# Patient Record
Sex: Female | Born: 1955 | Race: White | Hispanic: No | Marital: Married | Smoking: Never smoker
Health system: Southern US, Community
[De-identification: ages and names within clinical notes are randomized; demographics above are authoritative.]

## PROBLEM LIST (undated history)

## (undated) DIAGNOSIS — U071 COVID-19: Secondary | ICD-10-CM

## (undated) DIAGNOSIS — R200 Anesthesia of skin: Secondary | ICD-10-CM

## (undated) DIAGNOSIS — I209 Angina pectoris, unspecified: Secondary | ICD-10-CM

## (undated) DIAGNOSIS — I2510 Atherosclerotic heart disease of native coronary artery without angina pectoris: Secondary | ICD-10-CM

## (undated) DIAGNOSIS — I700 Atherosclerosis of aorta: Secondary | ICD-10-CM

## (undated) DIAGNOSIS — R0602 Shortness of breath: Secondary | ICD-10-CM

## (undated) DIAGNOSIS — M1990 Unspecified osteoarthritis, unspecified site: Secondary | ICD-10-CM

## (undated) DIAGNOSIS — M79671 Pain in right foot: Secondary | ICD-10-CM

## (undated) DIAGNOSIS — C187 Malignant neoplasm of sigmoid colon: Secondary | ICD-10-CM

## (undated) DIAGNOSIS — C189 Malignant neoplasm of colon, unspecified: Secondary | ICD-10-CM

## (undated) DIAGNOSIS — R002 Palpitations: Secondary | ICD-10-CM

## (undated) DIAGNOSIS — K219 Gastro-esophageal reflux disease without esophagitis: Secondary | ICD-10-CM

## (undated) HISTORY — DX: Atherosclerotic heart disease of native coronary artery without angina pectoris: I25.10

## (undated) HISTORY — DX: Malignant neoplasm of sigmoid colon: C18.7

## (undated) HISTORY — PX: COLON SURGERY: SHX602

## (undated) HISTORY — PX: BOWEL RESECTION: SHX1257

## (undated) HISTORY — DX: Malignant neoplasm of colon, unspecified: C18.9

## (undated) HISTORY — DX: Shortness of breath: R06.02

## (undated) HISTORY — DX: Angina pectoris, unspecified: I20.9

## (undated) HISTORY — DX: Anesthesia of skin: R20.0

## (undated) HISTORY — DX: Unspecified osteoarthritis, unspecified site: M19.90

## (undated) HISTORY — DX: Pain in right foot: M79.671

## (undated) HISTORY — PX: INCISION AND DRAINAGE ABSCESS: SHX5864

## (undated) HISTORY — DX: Atherosclerosis of aorta: I70.0

## (undated) HISTORY — DX: Palpitations: R00.2

---

## 1978-09-19 HISTORY — PX: ECTOPIC PREGNANCY SURGERY: SHX613

## 1999-06-14 ENCOUNTER — Other Ambulatory Visit: Admission: RE | Admit: 1999-06-14 | Discharge: 1999-06-14 | Payer: Self-pay | Admitting: Obstetrics & Gynecology

## 2003-04-07 ENCOUNTER — Ambulatory Visit (HOSPITAL_COMMUNITY): Admission: RE | Admit: 2003-04-07 | Discharge: 2003-04-07 | Payer: Self-pay | Admitting: Family Medicine

## 2004-03-09 ENCOUNTER — Ambulatory Visit (HOSPITAL_COMMUNITY): Admission: RE | Admit: 2004-03-09 | Discharge: 2004-03-09 | Payer: Self-pay | Admitting: *Deleted

## 2004-05-17 ENCOUNTER — Inpatient Hospital Stay (HOSPITAL_BASED_OUTPATIENT_CLINIC_OR_DEPARTMENT_OTHER): Admission: RE | Admit: 2004-05-17 | Discharge: 2004-05-17 | Payer: Self-pay | Admitting: Cardiology

## 2004-07-16 IMAGING — CR DG CHEST 2V
2 series · 2 of 2 positions shown · non-contrast
Comparison: none

CLINICAL DATA: Chest pain.  
 CHEST (TWO VIEWS)
 Two view chest with no priors.  
 Heart and vascularity normal.  Minimal scarring in the region of the lingula.  No active disease.  Osseous structures intact. 
 IMPRESSION
 No active cardiopulmonary disease.

[view not recorded (1 of 2)]
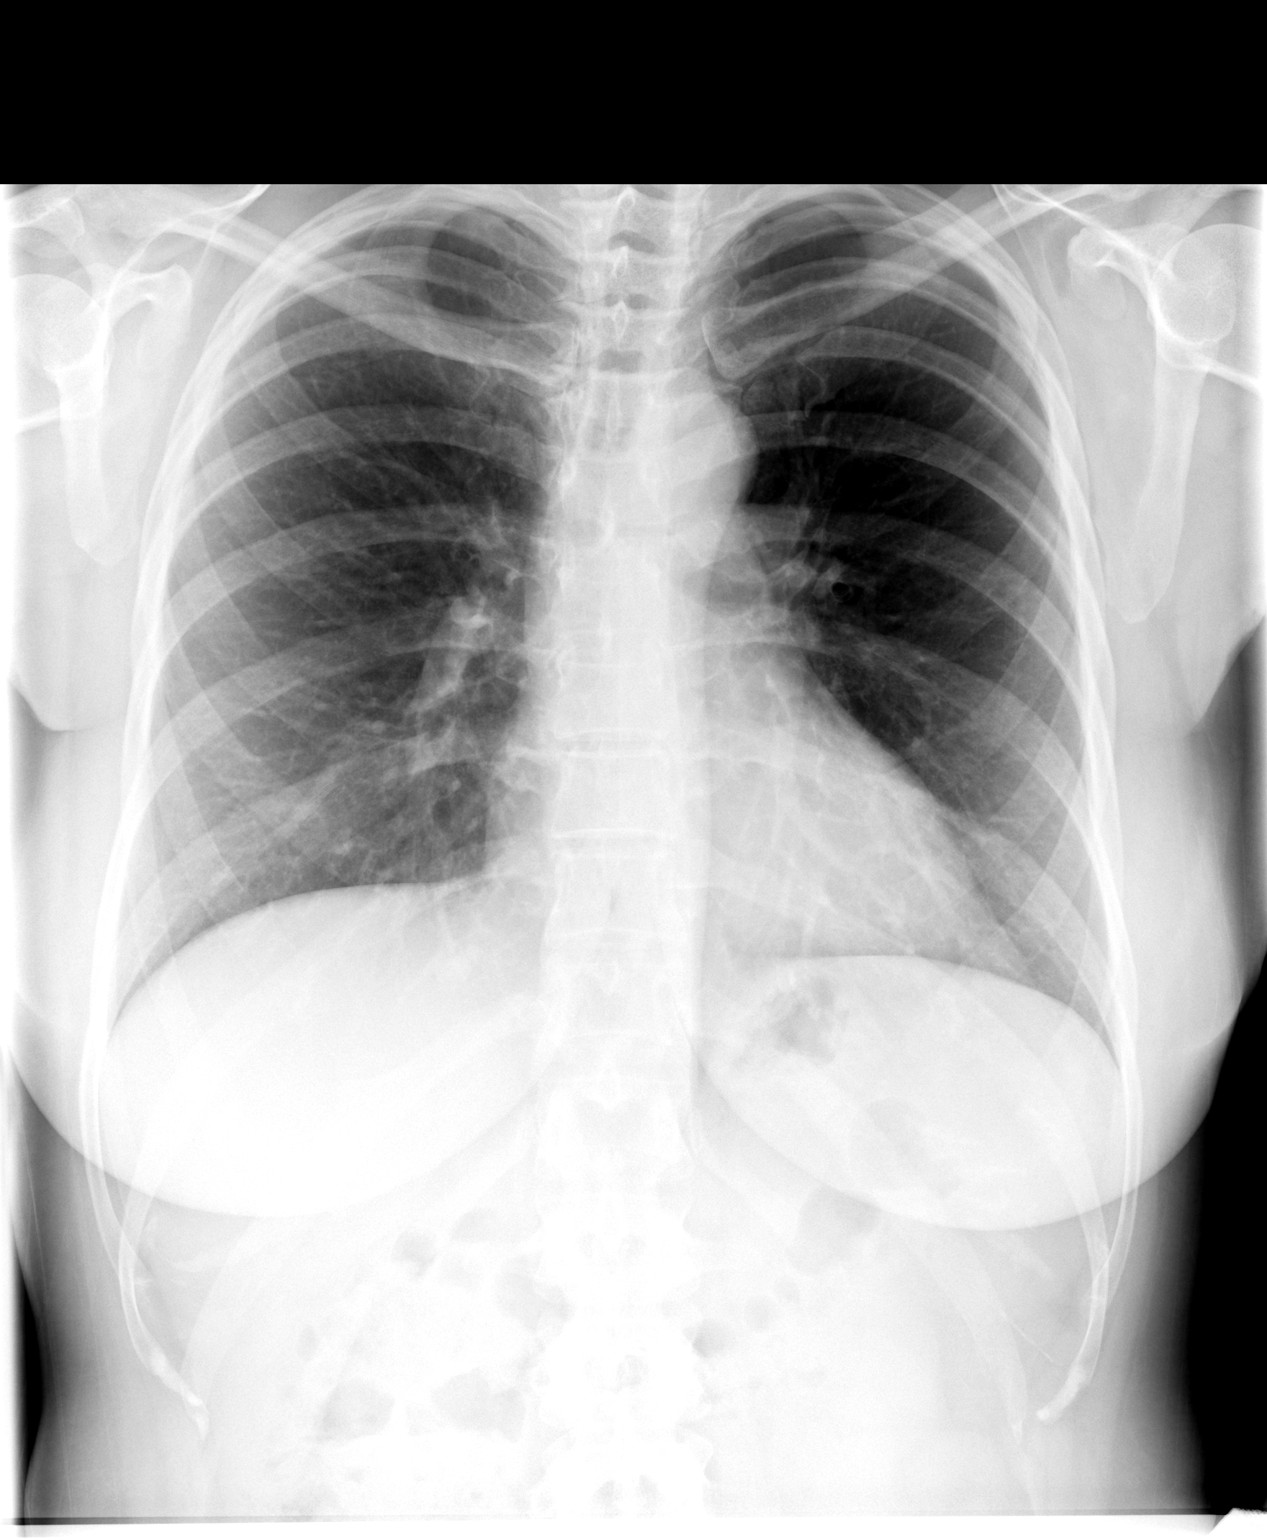

[view not recorded (2 of 2)]
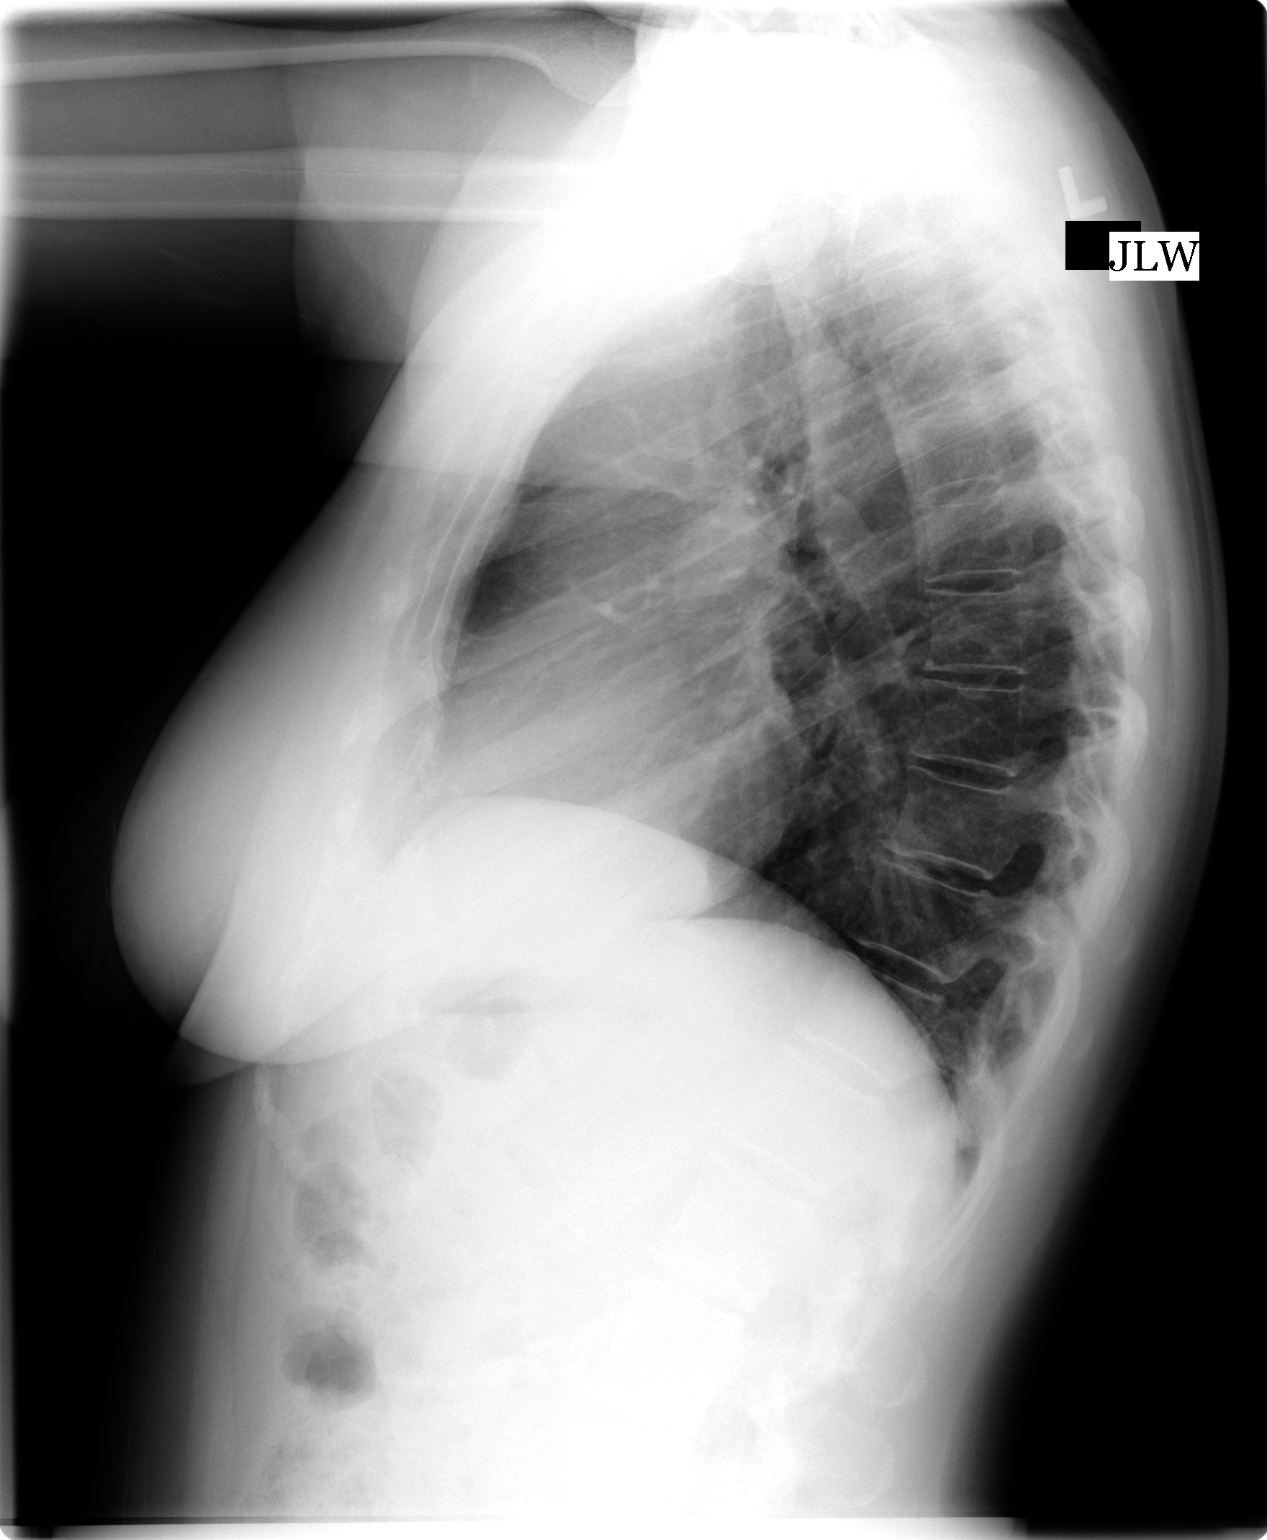

[2 of 2 positions shown; findings below may reference images not displayed]

## 2013-05-06 ENCOUNTER — Other Ambulatory Visit (HOSPITAL_COMMUNITY): Payer: Self-pay | Admitting: *Deleted

## 2013-05-06 NOTE — Telephone Encounter (Signed)
Opened in error on wrong patient 

## 2019-12-04 ENCOUNTER — Telehealth: Payer: Self-pay | Admitting: General Practice

## 2019-12-04 NOTE — Telephone Encounter (Signed)
I am not accepting new patients at this time.

## 2019-12-04 NOTE — Telephone Encounter (Signed)
Pt's husband is a patient of Dr.John and was wanting to know if there was any way that you could be her PCP. Is requesting a female.

## 2019-12-05 NOTE — Telephone Encounter (Signed)
Sorry not accepting new patients at this time.

## 2019-12-30 NOTE — Telephone Encounter (Signed)
Called patient and told her what offices were taking on new patients

## 2021-01-20 DIAGNOSIS — J209 Acute bronchitis, unspecified: Secondary | ICD-10-CM | POA: Diagnosis not present

## 2021-01-20 DIAGNOSIS — J019 Acute sinusitis, unspecified: Secondary | ICD-10-CM | POA: Diagnosis not present

## 2021-01-20 DIAGNOSIS — B349 Viral infection, unspecified: Secondary | ICD-10-CM | POA: Diagnosis not present

## 2021-01-20 DIAGNOSIS — U071 COVID-19: Secondary | ICD-10-CM | POA: Diagnosis not present

## 2021-04-27 DIAGNOSIS — R1031 Right lower quadrant pain: Secondary | ICD-10-CM | POA: Diagnosis not present

## 2021-04-27 DIAGNOSIS — Z6823 Body mass index (BMI) 23.0-23.9, adult: Secondary | ICD-10-CM | POA: Diagnosis not present

## 2021-04-28 ENCOUNTER — Other Ambulatory Visit: Payer: Self-pay

## 2021-04-28 ENCOUNTER — Ambulatory Visit (HOSPITAL_COMMUNITY)
Admission: RE | Admit: 2021-04-28 | Discharge: 2021-04-28 | Disposition: A | Discharge status: Home / Self Care | Payer: Medicare HMO | Source: Ambulatory Visit | Attending: Physician Assistant | Admitting: Physician Assistant

## 2021-04-28 ENCOUNTER — Other Ambulatory Visit: Payer: Self-pay | Admitting: Physician Assistant

## 2021-04-28 ENCOUNTER — Other Ambulatory Visit (HOSPITAL_COMMUNITY): Payer: Self-pay | Admitting: Physician Assistant

## 2021-04-28 DIAGNOSIS — R1031 Right lower quadrant pain: Secondary | ICD-10-CM

## 2021-04-28 DIAGNOSIS — K5720 Diverticulitis of large intestine with perforation and abscess without bleeding: Secondary | ICD-10-CM | POA: Diagnosis not present

## 2021-04-28 DIAGNOSIS — R109 Unspecified abdominal pain: Secondary | ICD-10-CM | POA: Diagnosis not present

## 2021-04-28 DIAGNOSIS — K5792 Diverticulitis of intestine, part unspecified, without perforation or abscess without bleeding: Secondary | ICD-10-CM | POA: Diagnosis not present

## 2021-04-28 MED ORDER — IOHEXOL 9 MG/ML PO SOLN
ORAL | Status: AC
  Filled 2021-04-28: qty 1000

## 2021-04-28 MED ORDER — IOHEXOL 350 MG/ML SOLN
100.0000 mL | Freq: Once | INTRAVENOUS | Status: AC | PRN
  Administered 2021-04-28: 80 mL via INTRAVENOUS

## 2021-04-30 ENCOUNTER — Other Ambulatory Visit: Payer: Self-pay

## 2021-04-30 ENCOUNTER — Encounter (HOSPITAL_COMMUNITY): Payer: Self-pay | Admitting: Internal Medicine

## 2021-04-30 ENCOUNTER — Inpatient Hospital Stay (HOSPITAL_COMMUNITY)
Admission: EM | Admit: 2021-04-30 | Discharge: 2021-05-03 | DRG: 392 | Disposition: A | Discharge status: Home / Self Care | Payer: Medicare HMO | Attending: Internal Medicine | Admitting: Internal Medicine

## 2021-04-30 DIAGNOSIS — Z8616 Personal history of COVID-19: Secondary | ICD-10-CM

## 2021-04-30 DIAGNOSIS — Z833 Family history of diabetes mellitus: Secondary | ICD-10-CM

## 2021-04-30 DIAGNOSIS — Z8249 Family history of ischemic heart disease and other diseases of the circulatory system: Secondary | ICD-10-CM

## 2021-04-30 DIAGNOSIS — K5720 Diverticulitis of large intestine with perforation and abscess without bleeding: Secondary | ICD-10-CM | POA: Insufficient documentation

## 2021-04-30 DIAGNOSIS — K219 Gastro-esophageal reflux disease without esophagitis: Secondary | ICD-10-CM | POA: Diagnosis not present

## 2021-04-30 DIAGNOSIS — K5792 Diverticulitis of intestine, part unspecified, without perforation or abscess without bleeding: Secondary | ICD-10-CM | POA: Diagnosis present

## 2021-04-30 HISTORY — DX: Gastro-esophageal reflux disease without esophagitis: K21.9

## 2021-04-30 HISTORY — DX: COVID-19: U07.1

## 2021-04-30 LAB — CBC WITH DIFFERENTIAL/PLATELET
Abs Immature Granulocytes: 0.04 10*3/uL (ref 0.00–0.07)
Basophils Absolute: 0.1 10*3/uL (ref 0.0–0.1)
Basophils Relative: 1 %
Eosinophils Absolute: 0.2 10*3/uL (ref 0.0–0.5)
Eosinophils Relative: 3 %
HCT: 40.1 % (ref 36.0–46.0)
Hemoglobin: 12.3 g/dL (ref 12.0–15.0)
Immature Granulocytes: 1 %
Lymphocytes Relative: 21 %
Lymphs Abs: 1.8 10*3/uL (ref 0.7–4.0)
MCH: 26.6 pg (ref 26.0–34.0)
MCHC: 30.7 g/dL (ref 30.0–36.0)
MCV: 86.8 fL (ref 80.0–100.0)
Monocytes Absolute: 0.6 10*3/uL (ref 0.1–1.0)
Monocytes Relative: 7 %
Neutro Abs: 6 10*3/uL (ref 1.7–7.7)
Neutrophils Relative %: 67 %
Platelets: 427 10*3/uL — ABNORMAL HIGH (ref 150–400)
RBC: 4.62 MIL/uL (ref 3.87–5.11)
RDW: 12.3 % (ref 11.5–15.5)
WBC: 8.8 10*3/uL (ref 4.0–10.5)
nRBC: 0 % (ref 0.0–0.2)

## 2021-04-30 LAB — COMPREHENSIVE METABOLIC PANEL
ALT: 9 U/L (ref 0–44)
AST: 14 U/L — ABNORMAL LOW (ref 15–41)
Albumin: 3.3 g/dL — ABNORMAL LOW (ref 3.5–5.0)
Alkaline Phosphatase: 42 U/L (ref 38–126)
Anion gap: 9 (ref 5–15)
BUN: 9 mg/dL (ref 8–23)
CO2: 25 mmol/L (ref 22–32)
Calcium: 9.1 mg/dL (ref 8.9–10.3)
Chloride: 106 mmol/L (ref 98–111)
Creatinine, Ser: 0.64 mg/dL (ref 0.44–1.00)
GFR, Estimated: >60 mL/min (ref >60)
Glucose, Bld: 93 mg/dL (ref 70–99)
Potassium: 3.7 mmol/L (ref 3.5–5.1)
Sodium: 140 mmol/L (ref 135–145)
Total Bilirubin: 0.4 mg/dL (ref 0.3–1.2)
Total Protein: 6.5 g/dL (ref 6.5–8.1)

## 2021-04-30 LAB — LIPASE, BLOOD: Lipase: 28 U/L (ref 11–51)

## 2021-04-30 LAB — URINALYSIS, ROUTINE W REFLEX MICROSCOPIC
Bilirubin Urine: NEGATIVE
Glucose, UA: NEGATIVE mg/dL
Hgb urine dipstick: NEGATIVE
Ketones, ur: NEGATIVE mg/dL
Leukocytes,Ua: NEGATIVE
Nitrite: NEGATIVE
Protein, ur: NEGATIVE mg/dL
Specific Gravity, Urine: 1.005 (ref 1.005–1.030)
pH: 7 (ref 5.0–8.0)

## 2021-04-30 LAB — RESP PANEL BY RT-PCR (FLU A&B, COVID) ARPGX2
Influenza A by PCR: NEGATIVE
Influenza B by PCR: NEGATIVE
SARS Coronavirus 2 by RT PCR: NEGATIVE

## 2021-04-30 MED ORDER — ENOXAPARIN SODIUM 40 MG/0.4ML IJ SOSY
40.0000 mg | PREFILLED_SYRINGE | INTRAMUSCULAR | Status: DC
  Administered 2021-05-01 – 2021-05-02 (×3): 40 mg via SUBCUTANEOUS
  Filled 2021-04-30 (×4): qty 0.4

## 2021-04-30 MED ORDER — LACTATED RINGERS IV SOLN: INTRAVENOUS | Status: AC

## 2021-04-30 MED ORDER — PANTOPRAZOLE SODIUM 40 MG PO TBEC
40.0000 mg | DELAYED_RELEASE_TABLET | Freq: Every day | ORAL | Status: DC
  Administered 2021-05-01 – 2021-05-03 (×3): 40 mg via ORAL
  Filled 2021-04-30 (×3): qty 1

## 2021-04-30 MED ORDER — SODIUM CHLORIDE 0.9% FLUSH
3.0000 mL | Freq: Two times a day (BID) | INTRAVENOUS | Status: DC
  Administered 2021-05-01 – 2021-05-03 (×5): 3 mL via INTRAVENOUS

## 2021-04-30 MED ORDER — ACETAMINOPHEN 325 MG PO TABS: 650.0000 mg | ORAL_TABLET | Freq: Four times a day (QID) | ORAL | Status: DC | PRN

## 2021-04-30 MED ORDER — ACETAMINOPHEN 650 MG RE SUPP: 650.0000 mg | Freq: Four times a day (QID) | RECTAL | Status: DC | PRN

## 2021-04-30 MED ORDER — LACTATED RINGERS IV BOLUS
1000.0000 mL | Freq: Once | INTRAVENOUS | Status: AC
  Administered 2021-04-30: 1000 mL via INTRAVENOUS

## 2021-04-30 MED ORDER — SODIUM CHLORIDE 0.9 % IV SOLN
2.0000 g | Freq: Once | INTRAVENOUS | Status: AC
  Administered 2021-04-30: 2 g via INTRAVENOUS
  Filled 2021-04-30: qty 20

## 2021-04-30 MED ORDER — HYDROCODONE-ACETAMINOPHEN 5-325 MG PO TABS: 1.0000 | ORAL_TABLET | Freq: Four times a day (QID) | ORAL | Status: DC | PRN

## 2021-04-30 MED ORDER — SODIUM CHLORIDE 0.9 % IV SOLN
2.0000 g | INTRAVENOUS | Status: DC
  Administered 2021-05-01 – 2021-05-02 (×2): 2 g via INTRAVENOUS
  Filled 2021-04-30 (×3): qty 20

## 2021-04-30 MED ORDER — METRONIDAZOLE 500 MG/100ML IV SOLN
500.0000 mg | Freq: Two times a day (BID) | INTRAVENOUS | Status: DC
  Administered 2021-05-01 – 2021-05-03 (×5): 500 mg via INTRAVENOUS
  Filled 2021-04-30 (×5): qty 100

## 2021-04-30 MED ORDER — METRONIDAZOLE 500 MG/100ML IV SOLN
500.0000 mg | Freq: Once | INTRAVENOUS | Status: AC
  Administered 2021-04-30: 500 mg via INTRAVENOUS
  Filled 2021-04-30: qty 100

## 2021-04-30 NOTE — ED Notes (Signed)
Denies pain or nausea

## 2021-04-30 NOTE — ED Triage Notes (Signed)
Pt here  from home with c/o abd pain , had a scan that showed diverticulitis , started on antibiotics , day 3

## 2021-04-30 NOTE — ED Notes (Signed)
EDP at [facility]. Pt alert, NAD, calm, interactive, resps e/u, speaking in clear complete sentences. Triage order results reviewed.

## 2021-04-30 NOTE — ED Notes (Addendum)
Reports sx onset 3 weeks ago, has been taking ABT orally at home for last 3d. Describes lower abd pressure, some back discomfort, also NV, no diarrhea. No vomiting in last 24 hours. Recent CT shows diverticulitis. Mentions some occ blood, but also has hemorrhoids also. Denies pain nausea or other sx at this time.

## 2021-04-30 NOTE — ED Provider Notes (Signed)
[facility] EMERGENCY DEPARTMENT Provider Note   CSN: [id number] Arrival date & time: 04/30/21  1445     History No chief complaint on file.   Vanessa Mcclure is a 65 y.o. female.  Patient is a 65 year old female with a history of GERD but otherwise is healthy presenting today with 3 weeks of persistent abdominal pain, frequent loose stools and intermittent fevers.  She has had no nausea or vomiting.  She saw her doctor on Tuesday and was sent for a CAT scan on Wednesday.  The CAT scan on Wednesday reported that she had diverticulitis and she started Augmentin.  She has had 4 doses of the Augmentin but continues to have slightly worsening abdominal pain and ongoing stools up to 5 or 6/day without blood.  She has been eating very little.  Her doctor called her today and because of the CAT scan results told her she needed to go to the emergency room.  The pain is crampy and comes in waves.  There is always a baseline pain but it is worse at times.  Does not radiate.  She denies any urinary symptoms.  No cough congestion or shortness of breath.  No similar symptoms in the past.  She has never had a colonoscopy.  The history is provided by the patient and medical records.      No past medical history on file.  There are no problems to display for this patient.      OB History   No obstetric history on file.     No family history on file.     Home Medications Prior to Admission medications   Not on File    Allergies    Patient has no allergy information on record.  Review of Systems   Review of Systems  All other systems reviewed and are negative.  Physical Exam Updated Vital Signs BP 118/72 (BP Location: Right Arm)   Pulse 78   Temp 97.9 F (36.6 C) (Oral)   Resp 16   SpO2 97%   Physical Exam Vitals and nursing note reviewed.  Constitutional:      General: She is not in acute distress.    Appearance: Normal appearance. She is well-developed.   HENT:     Head: Normocephalic and atraumatic.  Eyes:     Pupils: Pupils are equal, round, and reactive to light.  Cardiovascular:     Rate and Rhythm: Normal rate and regular rhythm.     Heart sounds: Normal heart sounds. No murmur heard.   No friction rub.  Pulmonary:     Effort: Pulmonary effort is normal.     Breath sounds: Normal breath sounds. No wheezing or rales.  Abdominal:     General: Bowel sounds are normal. There is no distension.     Palpations: Abdomen is soft.     Tenderness: There is abdominal tenderness in the right lower quadrant, suprapubic area and left lower quadrant. There is no right CVA tenderness, left CVA tenderness, guarding or rebound.  Musculoskeletal:        General: No tenderness. Normal range of motion.     Comments: No edema  Skin:    General: Skin is warm and dry.     Findings: No rash.  Neurological:     Mental Status: She is alert and oriented to person, place, and time. Mental status is at baseline.     Cranial Nerves: No cranial nerve deficit.  Psychiatric:  Mood and Affect: Mood normal.        Behavior: Behavior normal.    ED Results / Procedures / Treatments   Labs (all labs ordered are listed, but only abnormal results are displayed) Labs Reviewed  CBC WITH DIFFERENTIAL/PLATELET - Abnormal; Notable for the following components:      Result Value   Platelets 427 (*)    All other components within normal limits  COMPREHENSIVE METABOLIC PANEL - Abnormal; Notable for the following components:   Albumin 3.3 (*)    AST 14 (*)    All other components within normal limits  URINALYSIS, ROUTINE W REFLEX MICROSCOPIC - Abnormal; Notable for the following components:   Color, Urine COLORLESS (*)    All other components within normal limits  RESP PANEL BY RT-PCR (FLU A&B, COVID) ARPGX2  LIPASE, BLOOD    EKG None  Radiology No results found.  Procedures Procedures   Medications Ordered in ED Medications  cefTRIAXone  (ROCEPHIN) 2 g in sodium chloride 0.9 % 100 mL IVPB (has no administration in time range)    And  metroNIDAZOLE (FLAGYL) IVPB 500 mg (has no administration in time range)  lactated ringers bolus 1,000 mL (has no administration in time range)    ED Course  I have reviewed the triage vital signs and the nursing notes.  Pertinent labs & imaging results that were available during my care of the patient were reviewed by me and considered in my medical decision making (see chart for details).    MDM Rules/Calculators/A&P                           Patient presenting today with persistent abdominal pain.  She had a CT done on Wednesday 2 days prior to arrival that showed acute diverticulitis with developing abscess and minimal amount of air.  She had taken 4 doses of Augmentin but her doctor called her today and told her she needed to go to the emergency room.  She is well-appearing at baseline with normal vital signs.  Labs are reassuring.  However her pain is no better so suspect with only 4 doses of antibiotics her scan will not look much different.  Will admit for IV antibiotics.  MDM   Amount and/or Complexity of Data Reviewed Clinical lab tests: ordered and reviewed Tests in the radiology section of CPT: reviewed Independent visualization of images, tracings, or specimens: yes  Patient Progress Patient progress: stable   Final Clinical Impression(s) / ED Diagnoses Final diagnoses:  Diverticulitis of large intestine with abscess without bleeding    Rx / DC Orders ED Discharge Orders     None        Blanchie Dessert, MD 04/30/21 [phone]

## 2021-04-30 NOTE — ED Provider Notes (Signed)
Emergency Medicine Provider Triage Evaluation Note  Vanessa Mcclure , a 65 y.o. female  was evaluated in triage.  Pt complains of abd pain. Had ct which showed diverticulitis with abscess  Review of Systems  Positive: Abd pain Negative: nv  Physical Exam  There were no vitals taken for this visit. Gen:   Awake, no distress   Resp:  Normal effort  MSK:   Moves extremities without difficulty  Other:  Llq abd ttp  Medical Decision Making  Medically screening exam initiated at 3:16 PM.  Appropriate orders placed.  Vanessa Mcclure was informed that the remainder of the evaluation will be completed by another provider, this initial triage assessment does not replace that evaluation, and the importance of remaining in the ED until their evaluation is complete.     Rodney Booze, PA-C 04/30/21 1517    Pattricia Boss, MD 05/04/21 [phone]

## 2021-04-30 NOTE — H&P (Signed)
History and Physical   Vanessa Mcclure [id number] DOB: 1956/02/26 DOA: 04/30/2021  PCP: Rosalee Kaufman, PA-C   Patient coming from: Home  Chief Complaint: Abdominal pain, diverticulitis  HPI: Vanessa Mcclure is a 65 y.o. female with medical history significant of GERD, prior COVID-19 infection in May who presents with ongoing abdominal pain shown to be diverticulitis outpatient. Patient has had about 3 weeks of lower abdominal pain with associated diarrhea and intermittent fever.  She saw her outpatient doctor on Tuesday and was sent for CT on Wednesday found to have diverticulitis and was started on Augmentin.  Her symptoms were persistent and had worsened despite the Augmentin which she was received 4 doses and her doctor called her to discuss the CT results in detail which showed acute diverticulitis of the sigmoid colon and early developing 2 cm abscess and recommended to proceed to the ED for IV antibiotics and further treatment. Patient denies chills, chest pain, shortness of breath, constipation, nausea, vomiting.   ED Course: Vital signs in the ED are stable.  Lab work-up showed CMP with albumin 3.3.  CBC showed only platelets of 427.  Lipase normal.  Respiratory panel flu COVID pending.  Urinalysis normal.  No repeat imaging performed CT results done outpatient showed acute diverticulitis of the sigmoid colon with early developing 2 cm abscess and hiatal hernia with a simple ovarian cyst also noted.  Patient started on ceftriaxone and Flagyl in the ED.  Also received 1 L of fluids.  Review of Systems: As per HPI otherwise all other systems reviewed and are negative.  Past Medical History:  Diagnosis Date   COVID-19    GERD (gastroesophageal reflux disease)     Past Surgical History:  Procedure Laterality Date   Draper    Social History  reports that she has never smoked. She has never used smokeless tobacco. She reports that she does not  drink alcohol and does not use drugs.  Not on File  Family History  Problem Relation Age of Onset   Diabetes Mother    Heart disease Mother    Cancer Father    Heart disease Father    Cancer Brother   Reviewed on admission  Prior to Admission medications   Not on File  Omeprazole Magnesium Biotin  Physical Exam: Vitals:   04/30/21 1615  BP: 118/72  Pulse: 78  Resp: 16  Temp: 97.9 F (36.6 C)  TempSrc: Oral  SpO2: 97%   Physical Exam Constitutional:      General: She is not in acute distress.    Appearance: Normal appearance.  HENT:     Head: Normocephalic and atraumatic.     Mouth/Throat:     Mouth: Mucous membranes are moist.     Pharynx: Oropharynx is clear.  Eyes:     Extraocular Movements: Extraocular movements intact.     Pupils: Pupils are equal, round, and reactive to light.  Cardiovascular:     Rate and Rhythm: Normal rate and regular rhythm.     Pulses: Normal pulses.     Heart sounds: Normal heart sounds.  Pulmonary:     Effort: Pulmonary effort is normal. No respiratory distress.     Breath sounds: Normal breath sounds.  Abdominal:     General: Bowel sounds are normal. There is no distension.     Palpations: Abdomen is soft.     Tenderness: There is abdominal tenderness.  Musculoskeletal:        General:  No swelling or deformity.  Skin:    General: Skin is warm and dry.  Neurological:     General: No focal deficit present.     Mental Status: Mental status is at baseline.   Labs on Admission: I have personally reviewed following labs and imaging studies  CBC: Recent Labs  Lab 04/30/21 1518  WBC 8.8  NEUTROABS 6.0  HGB 12.3  HCT 40.1  MCV 86.8  PLT 427*    Basic Metabolic Panel: Recent Labs  Lab 04/30/21 1518  NA 140  K 3.7  CL 106  CO2 25  GLUCOSE 93  BUN 9  CREATININE 0.64  CALCIUM 9.1    GFR: CrCl cannot be calculated (Unknown ideal weight.).  Liver Function Tests: Recent Labs  Lab 04/30/21 1518  AST 14*  ALT  9  ALKPHOS 42  BILITOT 0.4  PROT 6.5  ALBUMIN 3.3*    Urine analysis:    Component Value Date/Time   COLORURINE COLORLESS (A) 04/30/2021 1518   APPEARANCEUR CLEAR 04/30/2021 1518   LABSPEC 1.005 04/30/2021 1518   PHURINE 7.0 04/30/2021 1518   [employer] 04/30/2021 1518   HGBUR NEGATIVE 04/30/2021 1518   BILIRUBINUR NEGATIVE 04/30/2021 1518   KETONESUR NEGATIVE 04/30/2021 1518   PROTEINUR NEGATIVE 04/30/2021 1518   NITRITE NEGATIVE 04/30/2021 1518   LEUKOCYTESUR NEGATIVE 04/30/2021 1518    Radiological Exams on Admission: No results found.  EKG: Not performed in ED  Assessment/Plan Principal Problem:   Acute diverticulitis Active Problems:   GERD (gastroesophageal reflux disease)  Acute diverticulitis > 3 weeks of lower abdominal pain.  With diarrhea and intermittent fevers. > CT outpatient showed acute diverticulitis with developing abscess. > Had taken 4 doses of Augmentin but symptoms were persistent/worsening and considering abscess she was sent to the ED for IV antibiotics. > Has remained stable in the ED.  Was started on ceftriaxone and Flagyl. - Continue with ceftriaxone and Flagyl - Full liquid diet - Continue IV fluids - We will provide pain control as needed  GERD - Continue home PPI   DVT prophylaxis: Lovenox  Code Status:   Full Family Communication:  Significant other updated at bedside Disposition Plan:   Patient is from:  Home  Anticipated DC to:  Home  Anticipated DC date:  1 to 3 days  Anticipated DC barriers: None  Consults called:  None Admission status:  Observation, MedSurg  Severity of Illness: The appropriate patient status for this patient is OBSERVATION. Observation status is judged to be reasonable and necessary in order to provide the required intensity of service to ensure the patient's safety. The patient's presenting symptoms, physical exam findings, and initial radiographic and laboratory data in the context of their  medical condition is felt to place them at decreased risk for further clinical deterioration. Furthermore, it is anticipated that the patient will be medically stable for discharge from the hospital within 2 midnights of admission. The following factors support the patient status of observation.   " The patient's presenting symptoms include abdominal pain, fever, diarrhea diverticulitis on outpatient CT scan. " The physical exam findings include abdominal pain. " The initial radiographic and laboratory data are ab work-up showed CMP with albumin 3.3.  CBC showed only platelets of 427.  Lipase normal.  Respiratory panel flu COVID pending.  Urinalysis normal.  No repeat imaging performed CT results done outpatient showed acute diverticulitis of the sigmoid colon with early developing 2 cm abscess and hiatal hernia with a simple ovarian cyst also  noted.   Marcelyn Bruins MD [facility]  How to contact the [facility] Attending or Consulting provider [place] or covering provider during after hours [place], for this patient?   Check the care team in [facility] and look for a) attending/consulting [facility] provider listed and b) the [facility] team listed Log into [email or web address] and use [place]'s universal password to access. If you do not have the password, please contact the hospital operator. Locate the [facility] provider you are looking for under [facility] and page to a number that you can be directly reached. If you still have difficulty reaching the provider, please page the [facility] (Director on Call) for the Hospitalists listed on amion for assistance.  04/30/2021, 7:30 PM

## 2021-05-01 DIAGNOSIS — K5792 Diverticulitis of intestine, part unspecified, without perforation or abscess without bleeding: Secondary | ICD-10-CM

## 2021-05-01 LAB — COMPREHENSIVE METABOLIC PANEL
ALT: 9 U/L (ref 0–44)
AST: 13 U/L — ABNORMAL LOW (ref 15–41)
Albumin: 2.8 g/dL — ABNORMAL LOW (ref 3.5–5.0)
Alkaline Phosphatase: 38 U/L (ref 38–126)
Anion gap: 7 (ref 5–15)
BUN: 6 mg/dL — ABNORMAL LOW (ref 8–23)
CO2: 29 mmol/L (ref 22–32)
Calcium: 8.9 mg/dL (ref 8.9–10.3)
Chloride: 106 mmol/L (ref 98–111)
Creatinine, Ser: 0.64 mg/dL (ref 0.44–1.00)
GFR, Estimated: >60 mL/min (ref >60)
Glucose, Bld: 92 mg/dL (ref 70–99)
Potassium: 4 mmol/L (ref 3.5–5.1)
Sodium: 142 mmol/L (ref 135–145)
Total Bilirubin: 0.1 mg/dL — ABNORMAL LOW (ref 0.3–1.2)
Total Protein: 5.6 g/dL — ABNORMAL LOW (ref 6.5–8.1)

## 2021-05-01 LAB — HIV ANTIBODY (ROUTINE TESTING W REFLEX): HIV Screen 4th Generation wRfx: NONREACTIVE

## 2021-05-01 LAB — CBC
HCT: 35.2 % — ABNORMAL LOW (ref 36.0–46.0)
Hemoglobin: 11.2 g/dL — ABNORMAL LOW (ref 12.0–15.0)
MCH: 27.4 pg (ref 26.0–34.0)
MCHC: 31.8 g/dL (ref 30.0–36.0)
MCV: 86.1 fL (ref 80.0–100.0)
Platelets: 382 10*3/uL (ref 150–400)
RBC: 4.09 MIL/uL (ref 3.87–5.11)
RDW: 12.3 % (ref 11.5–15.5)
WBC: 6.2 10*3/uL (ref 4.0–10.5)
nRBC: 0 % (ref 0.0–0.2)

## 2021-05-01 NOTE — Progress Notes (Signed)
PROGRESS NOTE    Vanessa Mcclure  [id number] DOB: 1955-11-30 DOA: 04/30/2021 PCP: Rosalee Kaufman, PA-C   Brief Narrative:  Vanessa Mcclure is a 65 y.o. female with medical history significant of GERD, prior COVID-19 infection in May who presents with ongoing abdominal pain shown to be diverticulitis outpatient.  After 3 weeks of worsening abdominal pain outpatient CT shows diverticulitis started on Augmentin with worsening symptoms, repeat CT here shows again diverticulitis with early developing 2 cm abscess, admitted to the hospital for IV antibiotics given failure of outpatient therapy and worsening infection.  Assessment & Plan:   Principal Problem:   Acute diverticulitis Active Problems:   GERD (gastroesophageal reflux disease)  Acute intractable abdominal pain secondary to acute diverticulitis, failure of outpatient therapy, POA -Patient does not meet sepsis criteria despite her profound infection with failure of outpatient antibiotics with multiple doses of Augmentin -Continue ceftriaxone and Flagyl, follow clinically -Cultures pending to ensure no disseminated infection -Full liquid diet, will advance slowly as tolerated -IV fluids in the interim until p.o. intake is appropriate   GERD - Continue home PPI     DVT prophylaxis:      Lovenox  Code Status:              Full Family Communication: None available, patient to update  Status is: Inpatient  Dispo: The patient is from: Home              Anticipated d/c is to: Home              Anticipated d/c date is: 24 to 48 hours              Patient currently not medically stable for discharge  Consultants:  None  Procedures:  None  Antimicrobials:  Ceftriaxone Flagyl  Subjective: No acute issues or events overnight, abdominal pain improving, denies nausea vomiting headache fever chills chest pain diarrhea or constipation.  Objective: Vitals:   04/30/21 1615 04/30/21 2319 05/01/21 0536  BP: 118/72  (!) 144/76 117/62  Pulse: 78 62 60  Resp: '16 18 18  '$ Temp: 97.9 F (36.6 C) 98.5 F (36.9 C) 98.4 F (36.9 C)  TempSrc: Oral Oral Oral  SpO2: 97% 98% 97%  Weight:   63.4 kg    Intake/Output Summary (Last 24 hours) at 05/01/2021 0800 Last data filed at 05/01/2021 0602 Gross per 24 hour  Intake 1955 ml  Output --  Net 1955 ml   Filed Weights   05/01/21 0536  Weight: 63.4 kg    Examination:  General:  Pleasantly resting in bed, No acute distress. HEENT:  Normocephalic atraumatic.  Sclerae nonicteric, noninjected.  Extraocular movements intact bilaterally. Neck:  Without mass or deformity.  Trachea is midline. Lungs:  Clear to auscultate bilaterally without rhonchi, wheeze, or rales. Heart:  Regular rate and rhythm.  Without murmurs, rubs, or gallops. Abdomen:  Soft, diffusely tender without rebound or guarding, no PMI40 Extremities: Without cyanosis, clubbing, edema, or obvious deformity. Vascular:  Dorsalis pedis and posterior tibial pulses palpable bilaterally. Skin:  Warm and dry, no erythema, no ulcerations.   Data Reviewed: I have personally reviewed following labs and imaging studies  CBC: Recent Labs  Lab 04/30/21 1518 05/01/21 0449  WBC 8.8 6.2  NEUTROABS 6.0  --   HGB 12.3 11.2*  HCT 40.1 35.2*  MCV 86.8 86.1  PLT 427* [id number]   Basic Metabolic Panel: Recent Labs  Lab 04/30/21 1518 05/01/21 0449  NA 140 142  K  3.7 4.0  CL 106 106  CO2 25 29  GLUCOSE 93 92  BUN 9 6*  CREATININE 0.64 0.64  CALCIUM 9.1 8.9   GFR: CrCl cannot be calculated (Unknown ideal weight.). Liver Function Tests: Recent Labs  Lab 04/30/21 1518 05/01/21 0449  AST 14* 13*  ALT 9 9  ALKPHOS 42 38  BILITOT 0.4 0.1*  PROT 6.5 5.6*  ALBUMIN 3.3* 2.8*   Recent Labs  Lab 04/30/21 1518  LIPASE 28   No results for input(s): AMMONIA in the last 168 hours. Coagulation Profile: No results for input(s): INR, PROTIME in the last 168 hours. Cardiac Enzymes: No results for  input(s): CKTOTAL, CKMB, CKMBINDEX, TROPONINI in the last 168 hours. BNP (last 3 results) No results for input(s): PROBNP in the last 8760 hours. HbA1C: No results for input(s): HGBA1C in the last 72 hours. CBG: No results for input(s): GLUCAP in the last 168 hours. Lipid Profile: No results for input(s): CHOL, HDL, LDLCALC, TRIG, CHOLHDL, LDLDIRECT in the last 72 hours. Thyroid Function Tests: No results for input(s): TSH, T4TOTAL, FREET4, T3FREE, THYROIDAB in the last 72 hours. Anemia Panel: No results for input(s): VITAMINB12, FOLATE, FERRITIN, TIBC, IRON, RETICCTPCT in the last 72 hours. Sepsis Labs: No results for input(s): PROCALCITON, LATICACIDVEN in the last 168 hours.  Recent Results (from the past 240 hour(s))  Resp Panel by RT-PCR (Flu A&B, Covid) Nasopharyngeal Swab     Status: None   Collection Time: 04/30/21  7:00 PM   Specimen: Nasopharyngeal Swab; Nasopharyngeal(NP) swabs in vial transport medium  Result Value Ref Range Status   SARS Coronavirus 2 by RT PCR NEGATIVE NEGATIVE Final    Comment: (NOTE) SARS-CoV-2 target nucleic acids are NOT DETECTED.  The SARS-CoV-2 RNA is generally detectable in upper respiratory specimens during the acute phase of infection. The lowest concentration of SARS-CoV-2 viral copies this assay can detect is 138 copies/mL. A negative result does not preclude SARS-Cov-2 infection and should not be used as the sole basis for treatment or other patient management decisions. A negative result may occur with  improper specimen collection/handling, submission of specimen other than nasopharyngeal swab, presence of viral mutation(s) within the areas targeted by this assay, and inadequate number of viral copies(<138 copies/mL). A negative result must be combined with clinical observations, patient history, and epidemiological information. The expected result is Negative.  Fact Sheet for Patients:   EntrepreneurPulse.com.au  Fact Sheet for Healthcare Providers:  IncredibleEmployment.be  This test is no t yet approved or cleared by the [place] FDA and  has been authorized for detection and/or diagnosis of SARS-CoV-2 by FDA under an Emergency Use Authorization (EUA). This EUA will remain  in effect (meaning this test can be used) for the duration of the COVID-19 declaration under Section 564(b)(1) of the Act, 21 U.S.C.section 360bbb-3(b)(1), unless the authorization is terminated  or revoked sooner.       Influenza A by PCR NEGATIVE NEGATIVE Final   Influenza B by PCR NEGATIVE NEGATIVE Final    Comment: (NOTE) The Xpert Xpress SARS-CoV-2/FLU/RSV plus assay is intended as an aid in the diagnosis of influenza from Nasopharyngeal swab specimens and should not be used as a sole basis for treatment. Nasal washings and aspirates are unacceptable for Xpert Xpress SARS-CoV-2/FLU/RSV testing.  Fact Sheet for Patients: EntrepreneurPulse.com.au  Fact Sheet for Healthcare Providers: IncredibleEmployment.be  This test is not yet approved or cleared by the [place] FDA and has been authorized for detection and/or diagnosis of SARS-CoV-2 by FDA  under an Emergency Use Authorization (EUA). This EUA will remain in effect (meaning this test can be used) for the duration of the COVID-19 declaration under Section 564(b)(1) of the Act, 21 U.S.C. section 360bbb-3(b)(1), unless the authorization is terminated or revoked.  Performed at [facility], [place] [address] [place], [place] [address]     Radiology Studies: No results found.  Scheduled Meds:  enoxaparin (LOVENOX) injection  40 mg Subcutaneous Q24H   pantoprazole  40 mg Oral Daily   sodium chloride flush  3 mL Intravenous Q12H   Continuous Infusions:  cefTRIAXone (ROCEPHIN)  IV     metronidazole 500 mg (05/01/21 0449)    LOS: 1 day   Time  spent: 40 min  Little Ishikawa, DO [facility]  If 7PM-7AM, please contact night-coverage [email or web address]  05/01/2021, 8:00 AM

## 2021-05-02 LAB — COMPREHENSIVE METABOLIC PANEL
ALT: 9 U/L (ref 0–44)
AST: 12 U/L — ABNORMAL LOW (ref 15–41)
Albumin: 3 g/dL — ABNORMAL LOW (ref 3.5–5.0)
Alkaline Phosphatase: 37 U/L — ABNORMAL LOW (ref 38–126)
Anion gap: 7 (ref 5–15)
BUN: <5 mg/dL — ABNORMAL LOW (ref 8–23)
CO2: 28 mmol/L (ref 22–32)
Calcium: 8.8 mg/dL — ABNORMAL LOW (ref 8.9–10.3)
Chloride: 107 mmol/L (ref 98–111)
Creatinine, Ser: 0.7 mg/dL (ref 0.44–1.00)
GFR, Estimated: >60 mL/min (ref >60)
Glucose, Bld: 94 mg/dL (ref 70–99)
Potassium: 3.8 mmol/L (ref 3.5–5.1)
Sodium: 142 mmol/L (ref 135–145)
Total Bilirubin: 0.2 mg/dL — ABNORMAL LOW (ref 0.3–1.2)
Total Protein: 6 g/dL — ABNORMAL LOW (ref 6.5–8.1)

## 2021-05-02 LAB — CBC
HCT: 36.9 % (ref 36.0–46.0)
Hemoglobin: 11.8 g/dL — ABNORMAL LOW (ref 12.0–15.0)
MCH: 27.5 pg (ref 26.0–34.0)
MCHC: 32 g/dL (ref 30.0–36.0)
MCV: 86 fL (ref 80.0–100.0)
Platelets: 398 10*3/uL (ref 150–400)
RBC: 4.29 MIL/uL (ref 3.87–5.11)
RDW: 12.5 % (ref 11.5–15.5)
WBC: 8.6 10*3/uL (ref 4.0–10.5)
nRBC: 0 % (ref 0.0–0.2)

## 2021-05-02 NOTE — Progress Notes (Signed)
PROGRESS NOTE    Vanessa Mcclure  [id number] DOB: 1956/08/20 DOA: 04/30/2021 PCP: Rosalee Kaufman, PA-C   Brief Narrative:  Vanessa Mcclure is a 65 y.o. female with medical history significant of GERD, prior COVID-19 infection in May who presents with ongoing abdominal pain shown to be diverticulitis outpatient.  After 3 weeks of worsening abdominal pain outpatient CT shows diverticulitis started on Augmentin with worsening symptoms, repeat CT here shows again diverticulitis with early developing 2 cm abscess, admitted to the hospital for IV antibiotics given failure of outpatient therapy and worsening infection.  Assessment & Plan:   Principal Problem:   Acute diverticulitis Active Problems:   GERD (gastroesophageal reflux disease)  Acute intractable abdominal pain secondary to acute diverticulitis, failure of outpatient therapy, POA -Patient does not meet sepsis criteria despite her profound infection with failure of outpatient antibiotics with multiple doses of Augmentin -Continue ceftriaxone and Flagyl, follow clinically -Cultures pending to ensure no disseminated infection -given improving symptoms this is less likely -Advance diet as tolerated, IV fluids discontinued   GERD - Continue home PPI   DVT prophylaxis: Lovenox  Code Status:  Full Family Communication: Daughter at bedside  Status is: Inpatient  Dispo: The patient is from: Home              Anticipated d/c is to: Home              Anticipated d/c date is: 24 to 48 hours              Patient currently not medically stable for discharge -if patient able to tolerate p.o. more appropriately over the next 24 hours transition to p.o. antibiotics and discharge is certainly reasonable  Consultants:  None  Procedures:  None  Antimicrobials:  Ceftriaxone Flagyl  Subjective: No acute issues or events overnight, abdominal pain improving, still having some loose stool but no overt diarrhea, otherwise  denies constipation headache fevers chills chest pain.    Objective: Vitals:   05/01/21 1825 05/01/21 1825 05/01/21 2300 05/02/21 0456  BP: 140/82 140/82 111/73 104/70  Pulse: 78 81 67 67  Resp: '16 16 18 18  '$ Temp: 98.7 F (37.1 C) 98.7 F (37.1 C) 98.4 F (36.9 C) 98.4 F (36.9 C)  TempSrc: Oral Oral Oral Oral  SpO2: 99% 100% 95% 95%  Weight:    62.6 kg    Intake/Output Summary (Last 24 hours) at 05/02/2021 0754 Last data filed at 05/01/2021 1648 Gross per 24 hour  Intake 67.61 ml  Output --  Net 67.61 ml    Filed Weights   05/01/21 0536 05/02/21 0456  Weight: 63.4 kg 62.6 kg    Examination:  General:  Pleasantly resting in bed, No acute distress. HEENT:  Normocephalic atraumatic.  Sclerae nonicteric, noninjected.  Extraocular movements intact bilaterally. Neck:  Without mass or deformity.  Trachea is midline. Lungs:  Clear to auscultate bilaterally without rhonchi, wheeze, or rales. Heart:  Regular rate and rhythm.  Without murmurs, rubs, or gallops. Abdomen:  Soft, diffusely tender without rebound or guarding, no PMI40 Extremities: Without cyanosis, clubbing, edema, or obvious deformity. Vascular:  Dorsalis pedis and posterior tibial pulses palpable bilaterally. Skin:  Warm and dry, no erythema, no ulcerations.   Data Reviewed: I have personally reviewed following labs and imaging studies  CBC: Recent Labs  Lab 04/30/21 1518 05/01/21 0449 05/02/21 0032  WBC 8.8 6.2 8.6  NEUTROABS 6.0  --   --   HGB 12.3 11.2* 11.8*  HCT 40.1  35.2* 36.9  MCV 86.8 86.1 86.0  PLT 427* 382 [id number]    Basic Metabolic Panel: Recent Labs  Lab 04/30/21 1518 05/01/21 0449 05/02/21 0032  NA 140 142 142  K 3.7 4.0 3.8  CL 106 106 107  CO2 '25 29 28  '$ GLUCOSE 93 92 94  BUN 9 6* <5*  CREATININE 0.64 0.64 0.70  CALCIUM 9.1 8.9 8.8*    GFR: CrCl cannot be calculated (Unknown ideal weight.). Liver Function Tests: Recent Labs  Lab 04/30/21 1518 05/01/21 0449 05/02/21 0032   AST 14* 13* 12*  ALT '9 9 9  '$ ALKPHOS 42 38 37*  BILITOT 0.4 0.1* 0.2*  PROT 6.5 5.6* 6.0*  ALBUMIN 3.3* 2.8* 3.0*    Recent Labs  Lab 04/30/21 1518  LIPASE 28    No results for input(s): AMMONIA in the last 168 hours. Coagulation Profile: No results for input(s): INR, PROTIME in the last 168 hours. Cardiac Enzymes: No results for input(s): CKTOTAL, CKMB, CKMBINDEX, TROPONINI in the last 168 hours. BNP (last 3 results) No results for input(s): PROBNP in the last 8760 hours. HbA1C: No results for input(s): HGBA1C in the last 72 hours. CBG: No results for input(s): GLUCAP in the last 168 hours. Lipid Profile: No results for input(s): CHOL, HDL, LDLCALC, TRIG, CHOLHDL, LDLDIRECT in the last 72 hours. Thyroid Function Tests: No results for input(s): TSH, T4TOTAL, FREET4, T3FREE, THYROIDAB in the last 72 hours. Anemia Panel: No results for input(s): VITAMINB12, FOLATE, FERRITIN, TIBC, IRON, RETICCTPCT in the last 72 hours. Sepsis Labs: No results for input(s): PROCALCITON, LATICACIDVEN in the last 168 hours.  Recent Results (from the past 240 hour(s))  Resp Panel by RT-PCR (Flu A&B, Covid) Nasopharyngeal Swab     Status: None   Collection Time: 04/30/21  7:00 PM   Specimen: Nasopharyngeal Swab; Nasopharyngeal(NP) swabs in vial transport medium  Result Value Ref Range Status   SARS Coronavirus 2 by RT PCR NEGATIVE NEGATIVE Final    Comment: (NOTE) SARS-CoV-2 target nucleic acids are NOT DETECTED.  The SARS-CoV-2 RNA is generally detectable in upper respiratory specimens during the acute phase of infection. The lowest concentration of SARS-CoV-2 viral copies this assay can detect is 138 copies/mL. A negative result does not preclude SARS-Cov-2 infection and should not be used as the sole basis for treatment or other patient management decisions. A negative result may occur with  improper specimen collection/handling, submission of specimen other than nasopharyngeal swab,  presence of viral mutation(s) within the areas targeted by this assay, and inadequate number of viral copies(<138 copies/mL). A negative result must be combined with clinical observations, patient history, and epidemiological information. The expected result is Negative.  Fact Sheet for Patients:  EntrepreneurPulse.com.au  Fact Sheet for Healthcare Providers:  IncredibleEmployment.be  This test is no t yet approved or cleared by the [place] FDA and  has been authorized for detection and/or diagnosis of SARS-CoV-2 by FDA under an Emergency Use Authorization (EUA). This EUA will remain  in effect (meaning this test can be used) for the duration of the COVID-19 declaration under Section 564(b)(1) of the Act, 21 U.S.C.section 360bbb-3(b)(1), unless the authorization is terminated  or revoked sooner.       Influenza A by PCR NEGATIVE NEGATIVE Final   Influenza B by PCR NEGATIVE NEGATIVE Final    Comment: (NOTE) The Xpert Xpress SARS-CoV-2/FLU/RSV plus assay is intended as an aid in the diagnosis of influenza from Nasopharyngeal swab specimens and should not be used as a sole  basis for treatment. Nasal washings and aspirates are unacceptable for Xpert Xpress SARS-CoV-2/FLU/RSV testing.  Fact Sheet for Patients: EntrepreneurPulse.com.au  Fact Sheet for Healthcare Providers: IncredibleEmployment.be  This test is not yet approved or cleared by the [place] FDA and has been authorized for detection and/or diagnosis of SARS-CoV-2 by FDA under an Emergency Use Authorization (EUA). This EUA will remain in effect (meaning this test can be used) for the duration of the COVID-19 declaration under Section 564(b)(1) of the Act, 21 U.S.C. section 360bbb-3(b)(1), unless the authorization is terminated or revoked.  Performed at [facility], [address], [place], [place] [address]      Radiology  Studies: No results found.  Scheduled Meds:  enoxaparin (LOVENOX) injection  40 mg Subcutaneous Q24H   pantoprazole  40 mg Oral Daily   sodium chloride flush  3 mL Intravenous Q12H   Continuous Infusions:  cefTRIAXone (ROCEPHIN)  IV 200 mL/hr at 05/01/21 1648   metronidazole 500 mg (05/02/21 0534)    LOS: 2 days   Time spent: 40 min  Little Ishikawa, DO [facility]  If 7PM-7AM, please contact night-coverage [email or web address]  05/02/2021, 7:54 AM

## 2021-05-03 LAB — CBC
HCT: 36 % (ref 36.0–46.0)
Hemoglobin: 11.7 g/dL — ABNORMAL LOW (ref 12.0–15.0)
MCH: 27.8 pg (ref 26.0–34.0)
MCHC: 32.5 g/dL (ref 30.0–36.0)
MCV: 85.5 fL (ref 80.0–100.0)
Platelets: 360 10*3/uL (ref 150–400)
RBC: 4.21 MIL/uL (ref 3.87–5.11)
RDW: 12.4 % (ref 11.5–15.5)
WBC: 7 10*3/uL (ref 4.0–10.5)
nRBC: 0 % (ref 0.0–0.2)

## 2021-05-03 LAB — COMPREHENSIVE METABOLIC PANEL
ALT: 10 U/L (ref 0–44)
AST: 14 U/L — ABNORMAL LOW (ref 15–41)
Albumin: 2.8 g/dL — ABNORMAL LOW (ref 3.5–5.0)
Alkaline Phosphatase: 32 U/L — ABNORMAL LOW (ref 38–126)
Anion gap: 7 (ref 5–15)
BUN: 7 mg/dL — ABNORMAL LOW (ref 8–23)
CO2: 27 mmol/L (ref 22–32)
Calcium: 8.6 mg/dL — ABNORMAL LOW (ref 8.9–10.3)
Chloride: 106 mmol/L (ref 98–111)
Creatinine, Ser: 0.76 mg/dL (ref 0.44–1.00)
GFR, Estimated: >60 mL/min (ref >60)
Glucose, Bld: 102 mg/dL — ABNORMAL HIGH (ref 70–99)
Potassium: 3.6 mmol/L (ref 3.5–5.1)
Sodium: 140 mmol/L (ref 135–145)
Total Bilirubin: 0.3 mg/dL (ref 0.3–1.2)
Total Protein: 5.7 g/dL — ABNORMAL LOW (ref 6.5–8.1)

## 2021-05-03 MED ORDER — PROBIOTIC (LACTOBACILLUS) PO CAPS: 1.0000 | ORAL_CAPSULE | Freq: Two times a day (BID) | ORAL | 0 refills | Status: DC

## 2021-05-03 MED ORDER — CEFUROXIME AXETIL 500 MG PO TABS: 500.0000 mg | ORAL_TABLET | Freq: Two times a day (BID) | ORAL | 0 refills | Status: AC

## 2021-05-03 MED ORDER — METRONIDAZOLE 500 MG PO TABS: 500.0000 mg | ORAL_TABLET | Freq: Two times a day (BID) | ORAL | 0 refills | Status: AC

## 2021-05-03 NOTE — Discharge Summary (Signed)
Physician Discharge Summary  Vanessa Mcclure [id number] DOB: July 15, 1956 DOA: 04/30/2021  PCP: Rosalee Kaufman, PA-C  Admit date: 04/30/2021 Discharge date: 05/03/2021  Admitted From: Home Disposition: Home  Recommendations for Outpatient Follow-up:  Follow up with PCP in 1-2 weeks Please obtain BMP/CBC in one week Repeat imaging pending PCP recommendations  Discharge Condition: Stable CODE STATUS: Full Diet recommendation: As tolerated  Brief/Interim Summary: Vanessa Mcclure is a 65 y.o. female with medical history significant of GERD, prior COVID-19 infection in May who presents with ongoing abdominal pain shown to be diverticulitis outpatient.  After 3 weeks of worsening abdominal pain outpatient CT shows diverticulitis started on Augmentin with worsening symptoms, repeat CT here shows again diverticulitis with early developing 2 cm abscess, admitted to the hospital for IV antibiotics given failure of outpatient therapy and worsening infection.   Acute intractable abdominal pain secondary to acute diverticulitis, failure of outpatient therapy, POA -Patient does not meet sepsis criteria  -Transition to cefuroxime, Flagyl at discharge, close follow-up with PCP, repeat imaging as recommended -If worsening symptoms nausea vomiting abdominal pain fevers or chills please return to the hospital immediately or call PCP for further evaluation   GERD - Continue home PPI      Discharge Instructions  Discharge Instructions     Call MD for:  persistant nausea and vomiting   Complete by: As directed    Call MD for:  severe uncontrolled pain   Complete by: As directed    Call MD for:  temperature >100.4   Complete by: As directed    Diet - low sodium heart healthy   Complete by: As directed    Increase activity slowly   Complete by: As directed       Allergies as of 05/03/2021       Reactions   Sulfa Antibiotics Itching, Swelling, Rash   Facial swelling         Medication List     STOP taking these medications    amoxicillin-clavulanate 875-125 MG tablet Commonly known as: AUGMENTIN       TAKE these medications    acetaminophen 500 MG tablet Commonly known as: TYLENOL Take 500 mg by mouth every 6 (six) hours as needed for fever.   Biotin 10000 MCG Tabs Take 10,000 mcg by mouth every morning.   cefUROXime 500 MG tablet Commonly known as: CEFTIN Take 1 tablet (500 mg total) by mouth 2 (two) times daily with a meal for 5 days.   ibuprofen 200 MG tablet Commonly known as: ADVIL Take 200-400 mg by mouth daily as needed for headache.   Magnesium 500 MG Tabs Take 500 mg by mouth every morning.   metroNIDAZOLE 500 MG tablet Commonly known as: Flagyl Take 1 tablet (500 mg total) by mouth 2 (two) times daily for 5 days.   NexIUM 20 MG capsule Generic drug: esomeprazole Take 20 mg by mouth every morning.   Refresh Plus 0.5 % Soln Generic drug: Carboxymethylcellulose Sod PF Place 1 drop into both eyes daily as needed (dry eyes).   vitamin C 1000 MG tablet Take 1,000 mg by mouth every morning.        Allergies  Allergen Reactions   Sulfa Antibiotics Itching, Swelling and Rash    Facial swelling    Consultations: None    Procedures/Studies: CT ABDOMEN PELVIS W CONTRAST  Result Date: 04/28/2021 CLINICAL DATA:  Dull lower abdominal pain over the last 2 weeks. Diarrhea. EXAM: CT ABDOMEN AND PELVIS WITH CONTRAST TECHNIQUE:  Multidetector CT imaging of the abdomen and pelvis was performed using the standard protocol following bolus administration of intravenous contrast. CONTRAST:  45m OMNIPAQUE IOHEXOL 350 MG/ML SOLN COMPARISON:  None. FINDINGS: Lower chest: Hiatal hernia.  Lung bases are clear. Hepatobiliary: No calcified gallstones. Few small liver cysts. Calcified granuloma in the right lobe. Small amount of air in portal venous branches. Pancreas: Normal appearance of the pancreas. Incidental duodenal diverticulum. Spleen:  Normal Adrenals/Urinary Tract: Adrenal glands are normal. Kidneys are normal. Bladder is normal. Stomach/Bowel: Stomach and small intestine are normal. Normal appendix. Diverticulosis of the left colon with acute diverticulitis in the distal sigmoid region. Possible developing 2 cm abscess within the mesenteric fat adjacent to that. No large or definitely drainable collection. Vascular/Lymphatic: Aortic atherosclerosis. IVC is normal. No retroperitoneal adenopathy. Reproductive: No pelvic mass. 2.3 cm simple appearing cyst of the right ovary. Other: No free fluid or air. Musculoskeletal: Normal IMPRESSION: Acute diverticulitis of the sigmoid colon. Early/developing 2 cm abscess within the adjacent fat. Small air bubbles in the peripheral portal vein branches of the liver secondary to this process. Hiatal hernia. 2.3 cm simple appearing cyst of the right ovary. No follow-up imaging recommended. Note: This recommendation does not apply to premenarchal patients and to those with increased risk (genetic, family history, elevated tumor markers or other high-risk factors) of ovarian cancer. Reference: JACR 2020 Feb; 17(2):248-254 Electronically Signed   By: MNelson ChimesM.D.   On: 04/28/2021 13:00     Subjective: No acute issues or events overnight   Discharge Exam: Vitals:   05/03/21 0059 05/03/21 0541  BP: 116/68 99/63  Pulse: 68 78  Resp: 18 18  Temp: 98.4 F (36.9 C) 97.8 F (36.6 C)  SpO2: 96% 96%   Vitals:   05/02/21 1159 05/02/21 1731 05/03/21 0059 05/03/21 0541  BP: 130/72 101/62 116/68 99/63  Pulse: 76 82 68 78  Resp: '19 18 18 18  '$ Temp: 98.7 F (37.1 C) 98.7 F (37.1 C) 98.4 F (36.9 C) 97.8 F (36.6 C)  TempSrc: Oral Oral Oral Oral  SpO2: 100% 100% 96% 96%  Weight:    62.6 kg    General: Pt is alert, awake, not in acute distress Cardiovascular: RRR, S1/S2 +, no rubs, no gallops Respiratory: CTA bilaterally, no wheezing, no rhonchi Abdominal: Soft, NT, ND, bowel sounds  + Extremities: no edema, no cyanosis    The results of significant diagnostics from this hospitalization (including imaging, microbiology, ancillary and laboratory) are listed below for reference.     Microbiology: Recent Results (from the past 240 hour(s))  Resp Panel by RT-PCR (Flu A&B, Covid) Nasopharyngeal Swab     Status: None   Collection Time: 04/30/21  7:00 PM   Specimen: Nasopharyngeal Swab; Nasopharyngeal(NP) swabs in vial transport medium  Result Value Ref Range Status   SARS Coronavirus 2 by RT PCR NEGATIVE NEGATIVE Final    Comment: (NOTE) SARS-CoV-2 target nucleic acids are NOT DETECTED.  The SARS-CoV-2 RNA is generally detectable in upper respiratory specimens during the acute phase of infection. The lowest concentration of SARS-CoV-2 viral copies this assay can detect is 138 copies/mL. A negative result does not preclude SARS-Cov-2 infection and should not be used as the sole basis for treatment or other patient management decisions. A negative result may occur with  improper specimen collection/handling, submission of specimen other than nasopharyngeal swab, presence of viral mutation(s) within the areas targeted by this assay, and inadequate number of viral copies(<138 copies/mL). A negative result must be combined  with clinical observations, patient history, and epidemiological information. The expected result is Negative.  Fact Sheet for Patients:  EntrepreneurPulse.com.au  Fact Sheet for Healthcare Providers:  IncredibleEmployment.be  This test is no t yet approved or cleared by the [place] FDA and  has been authorized for detection and/or diagnosis of SARS-CoV-2 by FDA under an Emergency Use Authorization (EUA). This EUA will remain  in effect (meaning this test can be used) for the duration of the COVID-19 declaration under Section 564(b)(1) of the Act, 21 U.S.C.section 360bbb-3(b)(1), unless the authorization  is terminated  or revoked sooner.       Influenza A by PCR NEGATIVE NEGATIVE Final   Influenza B by PCR NEGATIVE NEGATIVE Final    Comment: (NOTE) The Xpert Xpress SARS-CoV-2/FLU/RSV plus assay is intended as an aid in the diagnosis of influenza from Nasopharyngeal swab specimens and should not be used as a sole basis for treatment. Nasal washings and aspirates are unacceptable for Xpert Xpress SARS-CoV-2/FLU/RSV testing.  Fact Sheet for Patients: EntrepreneurPulse.com.au  Fact Sheet for Healthcare Providers: IncredibleEmployment.be  This test is not yet approved or cleared by the [place] FDA and has been authorized for detection and/or diagnosis of SARS-CoV-2 by FDA under an Emergency Use Authorization (EUA). This EUA will remain in effect (meaning this test can be used) for the duration of the COVID-19 declaration under Section 564(b)(1) of the Act, 21 U.S.C. section 360bbb-3(b)(1), unless the authorization is terminated or revoked.  Performed at [facility], [place] [address] [place], [place] [address]      Labs: BNP (last 3 results) No results for input(s): BNP in the last 8760 hours. Basic Metabolic Panel: Recent Labs  Lab 04/30/21 1518 05/01/21 0449 05/02/21 0032 05/03/21 0404  NA 140 142 142 140  K 3.7 4.0 3.8 3.6  CL 106 106 107 106  CO2 '25 29 28 27  '$ GLUCOSE 93 92 94 102*  BUN 9 6* <5* 7*  CREATININE 0.64 0.64 0.70 0.76  CALCIUM 9.1 8.9 8.8* 8.6*   Liver Function Tests: Recent Labs  Lab 04/30/21 1518 05/01/21 0449 05/02/21 0032 05/03/21 0404  AST 14* 13* 12* 14*  ALT '9 9 9 10  '$ ALKPHOS 42 38 37* 32*  BILITOT 0.4 0.1* 0.2* 0.3  PROT 6.5 5.6* 6.0* 5.7*  ALBUMIN 3.3* 2.8* 3.0* 2.8*   Recent Labs  Lab 04/30/21 1518  LIPASE 28   No results for input(s): AMMONIA in the last 168 hours. CBC: Recent Labs  Lab 04/30/21 1518 05/01/21 0449 05/02/21 0032 05/03/21 0404  WBC 8.8 6.2 8.6 7.0   NEUTROABS 6.0  --   --   --   HGB 12.3 11.2* 11.8* 11.7*  HCT 40.1 35.2* 36.9 36.0  MCV 86.8 86.1 86.0 85.5  PLT 427* 382 398 360   Cardiac Enzymes: No results for input(s): CKTOTAL, CKMB, CKMBINDEX, TROPONINI in the last 168 hours. BNP: Invalid input(s): POCBNP CBG: No results for input(s): GLUCAP in the last 168 hours. D-Dimer No results for input(s): DDIMER in the last 72 hours. Hgb A1c No results for input(s): HGBA1C in the last 72 hours. Lipid Profile No results for input(s): CHOL, HDL, LDLCALC, TRIG, CHOLHDL, LDLDIRECT in the last 72 hours. Thyroid function studies No results for input(s): TSH, T4TOTAL, T3FREE, THYROIDAB in the last 72 hours.  Invalid input(s): FREET3 Anemia work up No results for input(s): VITAMINB12, FOLATE, FERRITIN, TIBC, IRON, RETICCTPCT in the last 72 hours. Urinalysis    Component Value Date/Time   COLORURINE  COLORLESS (A) 04/30/2021 1518   APPEARANCEUR CLEAR 04/30/2021 1518   LABSPEC 1.005 04/30/2021 1518   PHURINE 7.0 04/30/2021 1518   GLUCOSEU NEGATIVE 04/30/2021 1518   HGBUR NEGATIVE 04/30/2021 1518   BILIRUBINUR NEGATIVE 04/30/2021 1518   KETONESUR NEGATIVE 04/30/2021 1518   PROTEINUR NEGATIVE 04/30/2021 1518   NITRITE NEGATIVE 04/30/2021 1518   LEUKOCYTESUR NEGATIVE 04/30/2021 1518   Sepsis Labs Invalid input(s): PROCALCITONIN,  WBC,  LACTICIDVEN Microbiology Recent Results (from the past 240 hour(s))  Resp Panel by RT-PCR (Flu A&B, Covid) Nasopharyngeal Swab     Status: None   Collection Time: 04/30/21  7:00 PM   Specimen: Nasopharyngeal Swab; Nasopharyngeal(NP) swabs in vial transport medium  Result Value Ref Range Status   SARS Coronavirus 2 by RT PCR NEGATIVE NEGATIVE Final    Comment: (NOTE) SARS-CoV-2 target nucleic acids are NOT DETECTED.  The SARS-CoV-2 RNA is generally detectable in upper respiratory specimens during the acute phase of infection. The lowest concentration of SARS-CoV-2 viral copies this assay can  detect is 138 copies/mL. A negative result does not preclude SARS-Cov-2 infection and should not be used as the sole basis for treatment or other patient management decisions. A negative result may occur with  improper specimen collection/handling, submission of specimen other than nasopharyngeal swab, presence of viral mutation(s) within the areas targeted by this assay, and inadequate number of viral copies(<138 copies/mL). A negative result must be combined with clinical observations, patient history, and epidemiological information. The expected result is Negative.  Fact Sheet for Patients:  EntrepreneurPulse.com.au  Fact Sheet for Healthcare Providers:  IncredibleEmployment.be  This test is no t yet approved or cleared by the [place] FDA and  has been authorized for detection and/or diagnosis of SARS-CoV-2 by FDA under an Emergency Use Authorization (EUA). This EUA will remain  in effect (meaning this test can be used) for the duration of the COVID-19 declaration under Section 564(b)(1) of the Act, 21 U.S.C.section 360bbb-3(b)(1), unless the authorization is terminated  or revoked sooner.       Influenza A by PCR NEGATIVE NEGATIVE Final   Influenza B by PCR NEGATIVE NEGATIVE Final    Comment: (NOTE) The Xpert Xpress SARS-CoV-2/FLU/RSV plus assay is intended as an aid in the diagnosis of influenza from Nasopharyngeal swab specimens and should not be used as a sole basis for treatment. Nasal washings and aspirates are unacceptable for Xpert Xpress SARS-CoV-2/FLU/RSV testing.  Fact Sheet for Patients: EntrepreneurPulse.com.au  Fact Sheet for Healthcare Providers: IncredibleEmployment.be  This test is not yet approved or cleared by the [place] FDA and has been authorized for detection and/or diagnosis of SARS-CoV-2 by FDA under an Emergency Use Authorization (EUA). This EUA will remain in  effect (meaning this test can be used) for the duration of the COVID-19 declaration under Section 564(b)(1) of the Act, 21 U.S.C. section 360bbb-3(b)(1), unless the authorization is terminated or revoked.  Performed at [facility], [place] [address], [place],  [address]      Time coordinating discharge: Over 30 minutes  SIGNED:   Little Ishikawa, DO [facility] 05/03/2021, 8:14 AM Pager   If 7PM-7AM, please contact night-coverage [email or web address]

## 2021-05-03 NOTE — Progress Notes (Signed)
Discharge instructions given. Patient verbalized understanding and all questions were answered. Patient husband will be here soon to take her home.

## 2021-05-03 NOTE — Care Management Important Message (Signed)
Important Message  Patient Details  Name: Vanessa Mcclure MRN: [id number] Date of Birth: 10-Aug-1956   Medicare Important Message Given:  Yes     Tomiko Schoon [address] 05/03/2021, 4:41 PM

## 2021-05-04 NOTE — Care Management Important Message (Signed)
Important Message  Patient Details  Name: Vanessa Mcclure MRN: [id number] Date of Birth: May 21, 1956   Medicare Important Message Given:  Yes Patient left prior to IM delivery will mail to the patient home address.     Micole Delehanty 05/04/2021, 9:16 AM

## 2021-05-07 DIAGNOSIS — K5792 Diverticulitis of intestine, part unspecified, without perforation or abscess without bleeding: Secondary | ICD-10-CM | POA: Diagnosis not present

## 2021-05-07 DIAGNOSIS — Z6822 Body mass index (BMI) 22.0-22.9, adult: Secondary | ICD-10-CM | POA: Diagnosis not present

## 2021-05-12 DIAGNOSIS — Z1331 Encounter for screening for depression: Secondary | ICD-10-CM | POA: Diagnosis not present

## 2021-05-12 DIAGNOSIS — Z6822 Body mass index (BMI) 22.0-22.9, adult: Secondary | ICD-10-CM | POA: Diagnosis not present

## 2021-05-12 DIAGNOSIS — K5792 Diverticulitis of intestine, part unspecified, without perforation or abscess without bleeding: Secondary | ICD-10-CM | POA: Diagnosis not present

## 2021-05-12 DIAGNOSIS — Z1389 Encounter for screening for other disorder: Secondary | ICD-10-CM | POA: Diagnosis not present

## 2021-05-12 DIAGNOSIS — K5720 Diverticulitis of large intestine with perforation and abscess without bleeding: Secondary | ICD-10-CM | POA: Diagnosis not present

## 2021-05-28 DIAGNOSIS — N83201 Unspecified ovarian cyst, right side: Secondary | ICD-10-CM | POA: Diagnosis not present

## 2021-05-28 DIAGNOSIS — K5720 Diverticulitis of large intestine with perforation and abscess without bleeding: Secondary | ICD-10-CM | POA: Diagnosis not present

## 2021-05-28 DIAGNOSIS — K5730 Diverticulosis of large intestine without perforation or abscess without bleeding: Secondary | ICD-10-CM | POA: Diagnosis not present

## 2021-05-28 DIAGNOSIS — K449 Diaphragmatic hernia without obstruction or gangrene: Secondary | ICD-10-CM | POA: Diagnosis not present

## 2021-05-28 DIAGNOSIS — K7689 Other specified diseases of liver: Secondary | ICD-10-CM | POA: Diagnosis not present

## 2021-06-02 ENCOUNTER — Encounter: Payer: Self-pay | Admitting: Internal Medicine

## 2021-06-16 DIAGNOSIS — R32 Unspecified urinary incontinence: Secondary | ICD-10-CM | POA: Diagnosis not present

## 2021-06-16 DIAGNOSIS — R030 Elevated blood-pressure reading, without diagnosis of hypertension: Secondary | ICD-10-CM | POA: Diagnosis not present

## 2021-06-16 DIAGNOSIS — Z809 Family history of malignant neoplasm, unspecified: Secondary | ICD-10-CM | POA: Diagnosis not present

## 2021-06-16 DIAGNOSIS — Z882 Allergy status to sulfonamides status: Secondary | ICD-10-CM | POA: Diagnosis not present

## 2021-06-16 DIAGNOSIS — Z8249 Family history of ischemic heart disease and other diseases of the circulatory system: Secondary | ICD-10-CM | POA: Diagnosis not present

## 2021-06-16 DIAGNOSIS — Z833 Family history of diabetes mellitus: Secondary | ICD-10-CM | POA: Diagnosis not present

## 2021-09-02 DIAGNOSIS — R222 Localized swelling, mass and lump, trunk: Secondary | ICD-10-CM | POA: Diagnosis not present

## 2021-09-02 DIAGNOSIS — K6389 Other specified diseases of intestine: Secondary | ICD-10-CM | POA: Diagnosis not present

## 2021-09-02 DIAGNOSIS — C772 Secondary and unspecified malignant neoplasm of intra-abdominal lymph nodes: Secondary | ICD-10-CM | POA: Diagnosis not present

## 2021-09-02 DIAGNOSIS — R109 Unspecified abdominal pain: Secondary | ICD-10-CM | POA: Diagnosis not present

## 2021-09-02 DIAGNOSIS — N83291 Other ovarian cyst, right side: Secondary | ICD-10-CM | POA: Diagnosis not present

## 2021-09-02 DIAGNOSIS — S31109A Unspecified open wound of abdominal wall, unspecified quadrant without penetration into peritoneal cavity, initial encounter: Secondary | ICD-10-CM | POA: Diagnosis not present

## 2021-09-02 DIAGNOSIS — Z20822 Contact with and (suspected) exposure to covid-19: Secondary | ICD-10-CM | POA: Diagnosis not present

## 2021-09-02 DIAGNOSIS — I700 Atherosclerosis of aorta: Secondary | ICD-10-CM | POA: Diagnosis not present

## 2021-09-02 DIAGNOSIS — K631 Perforation of intestine (nontraumatic): Secondary | ICD-10-CM | POA: Diagnosis not present

## 2021-09-02 DIAGNOSIS — C187 Malignant neoplasm of sigmoid colon: Secondary | ICD-10-CM | POA: Diagnosis not present

## 2021-09-02 DIAGNOSIS — K5720 Diverticulitis of large intestine with perforation and abscess without bleeding: Secondary | ICD-10-CM | POA: Diagnosis not present

## 2021-09-02 DIAGNOSIS — Z882 Allergy status to sulfonamides status: Secondary | ICD-10-CM | POA: Diagnosis not present

## 2021-09-02 DIAGNOSIS — T8189XA Other complications of procedures, not elsewhere classified, initial encounter: Secondary | ICD-10-CM | POA: Diagnosis not present

## 2021-09-03 DIAGNOSIS — K631 Perforation of intestine (nontraumatic): Secondary | ICD-10-CM | POA: Diagnosis not present

## 2021-09-06 ENCOUNTER — Ambulatory Visit: Payer: Medicare HMO | Admitting: Gastroenterology

## 2021-09-07 DIAGNOSIS — K631 Perforation of intestine (nontraumatic): Secondary | ICD-10-CM | POA: Insufficient documentation

## 2021-09-07 DIAGNOSIS — K6389 Other specified diseases of intestine: Secondary | ICD-10-CM | POA: Insufficient documentation

## 2021-09-09 DIAGNOSIS — Z933 Colostomy status: Secondary | ICD-10-CM | POA: Diagnosis not present

## 2021-09-10 DIAGNOSIS — Z433 Encounter for attention to colostomy: Secondary | ICD-10-CM | POA: Diagnosis not present

## 2021-09-10 DIAGNOSIS — Z1624 Resistance to multiple antibiotics: Secondary | ICD-10-CM | POA: Diagnosis not present

## 2021-09-10 DIAGNOSIS — Z8719 Personal history of other diseases of the digestive system: Secondary | ICD-10-CM | POA: Diagnosis not present

## 2021-09-10 DIAGNOSIS — K631 Perforation of intestine (nontraumatic): Secondary | ICD-10-CM | POA: Diagnosis not present

## 2021-09-10 DIAGNOSIS — Z48815 Encounter for surgical aftercare following surgery on the digestive system: Secondary | ICD-10-CM | POA: Diagnosis not present

## 2021-09-10 DIAGNOSIS — Z791 Long term (current) use of non-steroidal anti-inflammatories (NSAID): Secondary | ICD-10-CM | POA: Diagnosis not present

## 2021-09-13 DIAGNOSIS — Z433 Encounter for attention to colostomy: Secondary | ICD-10-CM | POA: Diagnosis not present

## 2021-09-13 DIAGNOSIS — Z48815 Encounter for surgical aftercare following surgery on the digestive system: Secondary | ICD-10-CM | POA: Diagnosis not present

## 2021-09-13 DIAGNOSIS — Z8719 Personal history of other diseases of the digestive system: Secondary | ICD-10-CM | POA: Diagnosis not present

## 2021-09-13 DIAGNOSIS — Z1624 Resistance to multiple antibiotics: Secondary | ICD-10-CM | POA: Diagnosis not present

## 2021-09-13 DIAGNOSIS — Z791 Long term (current) use of non-steroidal anti-inflammatories (NSAID): Secondary | ICD-10-CM | POA: Diagnosis not present

## 2021-09-14 DIAGNOSIS — N390 Urinary tract infection, site not specified: Secondary | ICD-10-CM | POA: Diagnosis not present

## 2021-09-15 DIAGNOSIS — Z8719 Personal history of other diseases of the digestive system: Secondary | ICD-10-CM | POA: Diagnosis not present

## 2021-09-15 DIAGNOSIS — Z791 Long term (current) use of non-steroidal anti-inflammatories (NSAID): Secondary | ICD-10-CM | POA: Diagnosis not present

## 2021-09-15 DIAGNOSIS — Z48815 Encounter for surgical aftercare following surgery on the digestive system: Secondary | ICD-10-CM | POA: Diagnosis not present

## 2021-09-15 DIAGNOSIS — Z1624 Resistance to multiple antibiotics: Secondary | ICD-10-CM | POA: Diagnosis not present

## 2021-09-15 DIAGNOSIS — Z433 Encounter for attention to colostomy: Secondary | ICD-10-CM | POA: Diagnosis not present

## 2021-09-17 DIAGNOSIS — Z791 Long term (current) use of non-steroidal anti-inflammatories (NSAID): Secondary | ICD-10-CM | POA: Diagnosis not present

## 2021-09-17 DIAGNOSIS — Z48815 Encounter for surgical aftercare following surgery on the digestive system: Secondary | ICD-10-CM | POA: Diagnosis not present

## 2021-09-17 DIAGNOSIS — Z433 Encounter for attention to colostomy: Secondary | ICD-10-CM | POA: Diagnosis not present

## 2021-09-17 DIAGNOSIS — Z1624 Resistance to multiple antibiotics: Secondary | ICD-10-CM | POA: Diagnosis not present

## 2021-09-17 DIAGNOSIS — Z8719 Personal history of other diseases of the digestive system: Secondary | ICD-10-CM | POA: Diagnosis not present

## 2021-09-20 DIAGNOSIS — Z1624 Resistance to multiple antibiotics: Secondary | ICD-10-CM | POA: Diagnosis not present

## 2021-09-20 DIAGNOSIS — Z791 Long term (current) use of non-steroidal anti-inflammatories (NSAID): Secondary | ICD-10-CM | POA: Diagnosis not present

## 2021-09-20 DIAGNOSIS — Z8719 Personal history of other diseases of the digestive system: Secondary | ICD-10-CM | POA: Diagnosis not present

## 2021-09-20 DIAGNOSIS — Z48815 Encounter for surgical aftercare following surgery on the digestive system: Secondary | ICD-10-CM | POA: Diagnosis not present

## 2021-09-20 DIAGNOSIS — Z433 Encounter for attention to colostomy: Secondary | ICD-10-CM | POA: Diagnosis not present

## 2021-09-22 DIAGNOSIS — C187 Malignant neoplasm of sigmoid colon: Secondary | ICD-10-CM | POA: Insufficient documentation

## 2021-09-24 DIAGNOSIS — Z791 Long term (current) use of non-steroidal anti-inflammatories (NSAID): Secondary | ICD-10-CM | POA: Diagnosis not present

## 2021-09-24 DIAGNOSIS — Z8719 Personal history of other diseases of the digestive system: Secondary | ICD-10-CM | POA: Diagnosis not present

## 2021-09-24 DIAGNOSIS — Z48815 Encounter for surgical aftercare following surgery on the digestive system: Secondary | ICD-10-CM | POA: Diagnosis not present

## 2021-09-24 DIAGNOSIS — Z433 Encounter for attention to colostomy: Secondary | ICD-10-CM | POA: Diagnosis not present

## 2021-09-24 DIAGNOSIS — Z1624 Resistance to multiple antibiotics: Secondary | ICD-10-CM | POA: Diagnosis not present

## 2021-09-27 DIAGNOSIS — Z1624 Resistance to multiple antibiotics: Secondary | ICD-10-CM | POA: Diagnosis not present

## 2021-09-27 DIAGNOSIS — Z791 Long term (current) use of non-steroidal anti-inflammatories (NSAID): Secondary | ICD-10-CM | POA: Diagnosis not present

## 2021-09-27 DIAGNOSIS — Z8719 Personal history of other diseases of the digestive system: Secondary | ICD-10-CM | POA: Diagnosis not present

## 2021-09-27 DIAGNOSIS — Z433 Encounter for attention to colostomy: Secondary | ICD-10-CM | POA: Diagnosis not present

## 2021-09-27 DIAGNOSIS — Z48815 Encounter for surgical aftercare following surgery on the digestive system: Secondary | ICD-10-CM | POA: Diagnosis not present

## 2021-09-29 DIAGNOSIS — Z1624 Resistance to multiple antibiotics: Secondary | ICD-10-CM | POA: Diagnosis not present

## 2021-09-29 DIAGNOSIS — Z8719 Personal history of other diseases of the digestive system: Secondary | ICD-10-CM | POA: Diagnosis not present

## 2021-09-29 DIAGNOSIS — Z48815 Encounter for surgical aftercare following surgery on the digestive system: Secondary | ICD-10-CM | POA: Diagnosis not present

## 2021-09-29 DIAGNOSIS — Z433 Encounter for attention to colostomy: Secondary | ICD-10-CM | POA: Diagnosis not present

## 2021-09-29 DIAGNOSIS — Z791 Long term (current) use of non-steroidal anti-inflammatories (NSAID): Secondary | ICD-10-CM | POA: Diagnosis not present

## 2021-10-01 DIAGNOSIS — Z1624 Resistance to multiple antibiotics: Secondary | ICD-10-CM | POA: Diagnosis not present

## 2021-10-01 DIAGNOSIS — Z48815 Encounter for surgical aftercare following surgery on the digestive system: Secondary | ICD-10-CM | POA: Diagnosis not present

## 2021-10-01 DIAGNOSIS — Z8719 Personal history of other diseases of the digestive system: Secondary | ICD-10-CM | POA: Diagnosis not present

## 2021-10-01 DIAGNOSIS — Z433 Encounter for attention to colostomy: Secondary | ICD-10-CM | POA: Diagnosis not present

## 2021-10-01 DIAGNOSIS — Z791 Long term (current) use of non-steroidal anti-inflammatories (NSAID): Secondary | ICD-10-CM | POA: Diagnosis not present

## 2021-10-04 DIAGNOSIS — Z1624 Resistance to multiple antibiotics: Secondary | ICD-10-CM | POA: Diagnosis not present

## 2021-10-04 DIAGNOSIS — Z48815 Encounter for surgical aftercare following surgery on the digestive system: Secondary | ICD-10-CM | POA: Diagnosis not present

## 2021-10-04 DIAGNOSIS — Z791 Long term (current) use of non-steroidal anti-inflammatories (NSAID): Secondary | ICD-10-CM | POA: Diagnosis not present

## 2021-10-04 DIAGNOSIS — Z433 Encounter for attention to colostomy: Secondary | ICD-10-CM | POA: Diagnosis not present

## 2021-10-04 DIAGNOSIS — Z8719 Personal history of other diseases of the digestive system: Secondary | ICD-10-CM | POA: Diagnosis not present

## 2021-10-08 DIAGNOSIS — K631 Perforation of intestine (nontraumatic): Secondary | ICD-10-CM | POA: Diagnosis not present

## 2021-10-08 DIAGNOSIS — Z433 Encounter for attention to colostomy: Secondary | ICD-10-CM | POA: Diagnosis not present

## 2021-10-08 DIAGNOSIS — Z791 Long term (current) use of non-steroidal anti-inflammatories (NSAID): Secondary | ICD-10-CM | POA: Diagnosis not present

## 2021-10-08 DIAGNOSIS — Z48815 Encounter for surgical aftercare following surgery on the digestive system: Secondary | ICD-10-CM | POA: Diagnosis not present

## 2021-10-08 DIAGNOSIS — Z8719 Personal history of other diseases of the digestive system: Secondary | ICD-10-CM | POA: Diagnosis not present

## 2021-10-08 DIAGNOSIS — K5792 Diverticulitis of intestine, part unspecified, without perforation or abscess without bleeding: Secondary | ICD-10-CM | POA: Diagnosis not present

## 2021-10-08 DIAGNOSIS — Z1624 Resistance to multiple antibiotics: Secondary | ICD-10-CM | POA: Diagnosis not present

## 2021-10-08 DIAGNOSIS — Z933 Colostomy status: Secondary | ICD-10-CM | POA: Diagnosis not present

## 2021-10-13 DIAGNOSIS — Z433 Encounter for attention to colostomy: Secondary | ICD-10-CM | POA: Diagnosis not present

## 2021-10-13 DIAGNOSIS — Z48815 Encounter for surgical aftercare following surgery on the digestive system: Secondary | ICD-10-CM | POA: Diagnosis not present

## 2021-10-13 DIAGNOSIS — Z1624 Resistance to multiple antibiotics: Secondary | ICD-10-CM | POA: Diagnosis not present

## 2021-10-13 DIAGNOSIS — Z791 Long term (current) use of non-steroidal anti-inflammatories (NSAID): Secondary | ICD-10-CM | POA: Diagnosis not present

## 2021-10-13 DIAGNOSIS — Z8719 Personal history of other diseases of the digestive system: Secondary | ICD-10-CM | POA: Diagnosis not present

## 2021-10-15 DIAGNOSIS — C187 Malignant neoplasm of sigmoid colon: Secondary | ICD-10-CM | POA: Diagnosis not present

## 2021-10-15 DIAGNOSIS — Z1239 Encounter for other screening for malignant neoplasm of breast: Secondary | ICD-10-CM | POA: Diagnosis not present

## 2021-10-15 DIAGNOSIS — Z1231 Encounter for screening mammogram for malignant neoplasm of breast: Secondary | ICD-10-CM | POA: Diagnosis not present

## 2021-10-21 DIAGNOSIS — Z8719 Personal history of other diseases of the digestive system: Secondary | ICD-10-CM | POA: Diagnosis not present

## 2021-10-21 DIAGNOSIS — Z1624 Resistance to multiple antibiotics: Secondary | ICD-10-CM | POA: Diagnosis not present

## 2021-10-21 DIAGNOSIS — Z48815 Encounter for surgical aftercare following surgery on the digestive system: Secondary | ICD-10-CM | POA: Diagnosis not present

## 2021-10-21 DIAGNOSIS — Z791 Long term (current) use of non-steroidal anti-inflammatories (NSAID): Secondary | ICD-10-CM | POA: Diagnosis not present

## 2021-10-21 DIAGNOSIS — Z433 Encounter for attention to colostomy: Secondary | ICD-10-CM | POA: Diagnosis not present

## 2021-10-21 DIAGNOSIS — C187 Malignant neoplasm of sigmoid colon: Secondary | ICD-10-CM | POA: Diagnosis not present

## 2021-10-22 DIAGNOSIS — Z933 Colostomy status: Secondary | ICD-10-CM | POA: Diagnosis not present

## 2021-10-22 DIAGNOSIS — K449 Diaphragmatic hernia without obstruction or gangrene: Secondary | ICD-10-CM | POA: Diagnosis not present

## 2021-10-22 DIAGNOSIS — I209 Angina pectoris, unspecified: Secondary | ICD-10-CM | POA: Diagnosis not present

## 2021-10-22 DIAGNOSIS — C187 Malignant neoplasm of sigmoid colon: Secondary | ICD-10-CM | POA: Diagnosis not present

## 2021-10-22 DIAGNOSIS — C189 Malignant neoplasm of colon, unspecified: Secondary | ICD-10-CM | POA: Diagnosis not present

## 2021-10-22 DIAGNOSIS — Z452 Encounter for adjustment and management of vascular access device: Secondary | ICD-10-CM | POA: Diagnosis not present

## 2021-10-22 DIAGNOSIS — Z79899 Other long term (current) drug therapy: Secondary | ICD-10-CM | POA: Diagnosis not present

## 2021-10-22 DIAGNOSIS — K219 Gastro-esophageal reflux disease without esophagitis: Secondary | ICD-10-CM | POA: Diagnosis not present

## 2021-10-25 DIAGNOSIS — C187 Malignant neoplasm of sigmoid colon: Secondary | ICD-10-CM | POA: Diagnosis not present

## 2021-10-25 DIAGNOSIS — C189 Malignant neoplasm of colon, unspecified: Secondary | ICD-10-CM | POA: Diagnosis not present

## 2021-10-26 ENCOUNTER — Other Ambulatory Visit (HOSPITAL_COMMUNITY): Payer: Self-pay | Admitting: Hematology and Oncology

## 2021-10-26 DIAGNOSIS — C187 Malignant neoplasm of sigmoid colon: Secondary | ICD-10-CM

## 2021-10-27 DIAGNOSIS — Z791 Long term (current) use of non-steroidal anti-inflammatories (NSAID): Secondary | ICD-10-CM | POA: Diagnosis not present

## 2021-10-27 DIAGNOSIS — Z433 Encounter for attention to colostomy: Secondary | ICD-10-CM | POA: Diagnosis not present

## 2021-10-27 DIAGNOSIS — Z1624 Resistance to multiple antibiotics: Secondary | ICD-10-CM | POA: Diagnosis not present

## 2021-10-27 DIAGNOSIS — Z48815 Encounter for surgical aftercare following surgery on the digestive system: Secondary | ICD-10-CM | POA: Diagnosis not present

## 2021-10-27 DIAGNOSIS — Z8719 Personal history of other diseases of the digestive system: Secondary | ICD-10-CM | POA: Diagnosis not present

## 2021-10-28 ENCOUNTER — Ambulatory Visit (HOSPITAL_COMMUNITY)
Admission: RE | Admit: 2021-10-28 | Discharge: 2021-10-28 | Disposition: A | Discharge status: Home / Self Care | Payer: Medicare HMO | Source: Ambulatory Visit | Attending: Hematology and Oncology | Admitting: Hematology and Oncology

## 2021-10-28 ENCOUNTER — Other Ambulatory Visit: Payer: Self-pay

## 2021-10-28 DIAGNOSIS — C187 Malignant neoplasm of sigmoid colon: Secondary | ICD-10-CM | POA: Diagnosis not present

## 2021-10-28 DIAGNOSIS — K449 Diaphragmatic hernia without obstruction or gangrene: Secondary | ICD-10-CM | POA: Diagnosis not present

## 2021-10-28 DIAGNOSIS — K7689 Other specified diseases of liver: Secondary | ICD-10-CM | POA: Diagnosis not present

## 2021-10-28 DIAGNOSIS — K435 Parastomal hernia without obstruction or  gangrene: Secondary | ICD-10-CM | POA: Diagnosis not present

## 2021-10-28 DIAGNOSIS — C189 Malignant neoplasm of colon, unspecified: Secondary | ICD-10-CM | POA: Diagnosis not present

## 2021-10-28 MED ORDER — FLUDEOXYGLUCOSE F - 18 (FDG) INJECTION
7.4240 | Freq: Once | INTRAVENOUS | Status: AC | PRN
  Administered 2021-10-28: 7.424 via INTRAVENOUS

## 2021-11-01 DIAGNOSIS — Z8042 Family history of malignant neoplasm of prostate: Secondary | ICD-10-CM | POA: Diagnosis not present

## 2021-11-01 DIAGNOSIS — Z808 Family history of malignant neoplasm of other organs or systems: Secondary | ICD-10-CM | POA: Diagnosis not present

## 2021-11-01 DIAGNOSIS — Z882 Allergy status to sulfonamides status: Secondary | ICD-10-CM | POA: Diagnosis not present

## 2021-11-01 DIAGNOSIS — Z933 Colostomy status: Secondary | ICD-10-CM | POA: Diagnosis not present

## 2021-11-01 DIAGNOSIS — Z8616 Personal history of COVID-19: Secondary | ICD-10-CM | POA: Diagnosis not present

## 2021-11-01 DIAGNOSIS — C187 Malignant neoplasm of sigmoid colon: Secondary | ICD-10-CM | POA: Diagnosis not present

## 2021-11-01 DIAGNOSIS — Z803 Family history of malignant neoplasm of breast: Secondary | ICD-10-CM | POA: Diagnosis not present

## 2021-11-02 DIAGNOSIS — C187 Malignant neoplasm of sigmoid colon: Secondary | ICD-10-CM | POA: Diagnosis not present

## 2021-11-03 DIAGNOSIS — C187 Malignant neoplasm of sigmoid colon: Secondary | ICD-10-CM | POA: Diagnosis not present

## 2021-11-03 DIAGNOSIS — Z433 Encounter for attention to colostomy: Secondary | ICD-10-CM | POA: Diagnosis not present

## 2021-11-03 DIAGNOSIS — Z791 Long term (current) use of non-steroidal anti-inflammatories (NSAID): Secondary | ICD-10-CM | POA: Diagnosis not present

## 2021-11-03 DIAGNOSIS — Z8719 Personal history of other diseases of the digestive system: Secondary | ICD-10-CM | POA: Diagnosis not present

## 2021-11-03 DIAGNOSIS — Z48815 Encounter for surgical aftercare following surgery on the digestive system: Secondary | ICD-10-CM | POA: Diagnosis not present

## 2021-11-03 DIAGNOSIS — Z1624 Resistance to multiple antibiotics: Secondary | ICD-10-CM | POA: Diagnosis not present

## 2021-11-05 DIAGNOSIS — C187 Malignant neoplasm of sigmoid colon: Secondary | ICD-10-CM | POA: Diagnosis not present

## 2021-11-07 DIAGNOSIS — C187 Malignant neoplasm of sigmoid colon: Secondary | ICD-10-CM | POA: Diagnosis not present

## 2021-11-08 DIAGNOSIS — Z1159 Encounter for screening for other viral diseases: Secondary | ICD-10-CM | POA: Diagnosis not present

## 2021-11-08 DIAGNOSIS — R799 Abnormal finding of blood chemistry, unspecified: Secondary | ICD-10-CM | POA: Diagnosis not present

## 2021-11-08 DIAGNOSIS — C187 Malignant neoplasm of sigmoid colon: Secondary | ICD-10-CM | POA: Diagnosis not present

## 2021-11-08 DIAGNOSIS — K6389 Other specified diseases of intestine: Secondary | ICD-10-CM | POA: Diagnosis not present

## 2021-11-08 DIAGNOSIS — K631 Perforation of intestine (nontraumatic): Secondary | ICD-10-CM | POA: Diagnosis not present

## 2021-11-08 DIAGNOSIS — K219 Gastro-esophageal reflux disease without esophagitis: Secondary | ICD-10-CM | POA: Diagnosis not present

## 2021-11-08 DIAGNOSIS — K5792 Diverticulitis of intestine, part unspecified, without perforation or abscess without bleeding: Secondary | ICD-10-CM | POA: Diagnosis not present

## 2021-11-15 DIAGNOSIS — C187 Malignant neoplasm of sigmoid colon: Secondary | ICD-10-CM | POA: Diagnosis not present

## 2021-11-15 DIAGNOSIS — Z5111 Encounter for antineoplastic chemotherapy: Secondary | ICD-10-CM | POA: Diagnosis not present

## 2021-11-17 DIAGNOSIS — C187 Malignant neoplasm of sigmoid colon: Secondary | ICD-10-CM | POA: Diagnosis not present

## 2021-11-25 DIAGNOSIS — K631 Perforation of intestine (nontraumatic): Secondary | ICD-10-CM | POA: Diagnosis not present

## 2021-11-25 DIAGNOSIS — K6389 Other specified diseases of intestine: Secondary | ICD-10-CM | POA: Diagnosis not present

## 2021-11-25 DIAGNOSIS — R799 Abnormal finding of blood chemistry, unspecified: Secondary | ICD-10-CM | POA: Diagnosis not present

## 2021-11-25 DIAGNOSIS — K5792 Diverticulitis of intestine, part unspecified, without perforation or abscess without bleeding: Secondary | ICD-10-CM | POA: Diagnosis not present

## 2021-11-25 DIAGNOSIS — C187 Malignant neoplasm of sigmoid colon: Secondary | ICD-10-CM | POA: Diagnosis not present

## 2021-11-25 DIAGNOSIS — K219 Gastro-esophageal reflux disease without esophagitis: Secondary | ICD-10-CM | POA: Diagnosis not present

## 2021-11-30 DIAGNOSIS — Z5111 Encounter for antineoplastic chemotherapy: Secondary | ICD-10-CM | POA: Diagnosis not present

## 2021-11-30 DIAGNOSIS — Z79899 Other long term (current) drug therapy: Secondary | ICD-10-CM | POA: Diagnosis not present

## 2021-11-30 DIAGNOSIS — C187 Malignant neoplasm of sigmoid colon: Secondary | ICD-10-CM | POA: Diagnosis not present

## 2021-12-02 DIAGNOSIS — Z452 Encounter for adjustment and management of vascular access device: Secondary | ICD-10-CM | POA: Diagnosis not present

## 2021-12-02 DIAGNOSIS — C187 Malignant neoplasm of sigmoid colon: Secondary | ICD-10-CM | POA: Diagnosis not present

## 2021-12-06 DIAGNOSIS — C187 Malignant neoplasm of sigmoid colon: Secondary | ICD-10-CM | POA: Diagnosis not present

## 2021-12-10 DIAGNOSIS — R7989 Other specified abnormal findings of blood chemistry: Secondary | ICD-10-CM | POA: Diagnosis not present

## 2021-12-10 DIAGNOSIS — K6389 Other specified diseases of intestine: Secondary | ICD-10-CM | POA: Diagnosis not present

## 2021-12-10 DIAGNOSIS — C187 Malignant neoplasm of sigmoid colon: Secondary | ICD-10-CM | POA: Diagnosis not present

## 2021-12-10 DIAGNOSIS — K219 Gastro-esophageal reflux disease without esophagitis: Secondary | ICD-10-CM | POA: Diagnosis not present

## 2021-12-10 DIAGNOSIS — K631 Perforation of intestine (nontraumatic): Secondary | ICD-10-CM | POA: Diagnosis not present

## 2021-12-10 DIAGNOSIS — K5792 Diverticulitis of intestine, part unspecified, without perforation or abscess without bleeding: Secondary | ICD-10-CM | POA: Diagnosis not present

## 2021-12-13 DIAGNOSIS — Z5112 Encounter for antineoplastic immunotherapy: Secondary | ICD-10-CM | POA: Diagnosis not present

## 2021-12-13 DIAGNOSIS — Z5111 Encounter for antineoplastic chemotherapy: Secondary | ICD-10-CM | POA: Diagnosis not present

## 2021-12-13 DIAGNOSIS — C187 Malignant neoplasm of sigmoid colon: Secondary | ICD-10-CM | POA: Diagnosis not present

## 2021-12-15 DIAGNOSIS — Z452 Encounter for adjustment and management of vascular access device: Secondary | ICD-10-CM | POA: Diagnosis not present

## 2021-12-15 DIAGNOSIS — C187 Malignant neoplasm of sigmoid colon: Secondary | ICD-10-CM | POA: Diagnosis not present

## 2021-12-20 DIAGNOSIS — Z1231 Encounter for screening mammogram for malignant neoplasm of breast: Secondary | ICD-10-CM | POA: Diagnosis not present

## 2021-12-20 DIAGNOSIS — Z1239 Encounter for other screening for malignant neoplasm of breast: Secondary | ICD-10-CM | POA: Diagnosis not present

## 2021-12-22 DIAGNOSIS — N6489 Other specified disorders of breast: Secondary | ICD-10-CM | POA: Diagnosis not present

## 2021-12-22 DIAGNOSIS — R928 Other abnormal and inconclusive findings on diagnostic imaging of breast: Secondary | ICD-10-CM | POA: Diagnosis not present

## 2021-12-22 DIAGNOSIS — R922 Inconclusive mammogram: Secondary | ICD-10-CM | POA: Diagnosis not present

## 2021-12-23 DIAGNOSIS — C187 Malignant neoplasm of sigmoid colon: Secondary | ICD-10-CM | POA: Diagnosis not present

## 2021-12-23 DIAGNOSIS — T451X5A Adverse effect of antineoplastic and immunosuppressive drugs, initial encounter: Secondary | ICD-10-CM | POA: Diagnosis not present

## 2021-12-23 DIAGNOSIS — K5792 Diverticulitis of intestine, part unspecified, without perforation or abscess without bleeding: Secondary | ICD-10-CM | POA: Diagnosis not present

## 2021-12-23 DIAGNOSIS — K631 Perforation of intestine (nontraumatic): Secondary | ICD-10-CM | POA: Diagnosis not present

## 2021-12-23 DIAGNOSIS — G620 Drug-induced polyneuropathy: Secondary | ICD-10-CM | POA: Diagnosis not present

## 2021-12-23 DIAGNOSIS — K219 Gastro-esophageal reflux disease without esophagitis: Secondary | ICD-10-CM | POA: Diagnosis not present

## 2021-12-23 DIAGNOSIS — R799 Abnormal finding of blood chemistry, unspecified: Secondary | ICD-10-CM | POA: Diagnosis not present

## 2021-12-23 DIAGNOSIS — K6389 Other specified diseases of intestine: Secondary | ICD-10-CM | POA: Diagnosis not present

## 2021-12-28 DIAGNOSIS — Z5111 Encounter for antineoplastic chemotherapy: Secondary | ICD-10-CM | POA: Diagnosis not present

## 2021-12-28 DIAGNOSIS — Z5112 Encounter for antineoplastic immunotherapy: Secondary | ICD-10-CM | POA: Diagnosis not present

## 2021-12-28 DIAGNOSIS — C187 Malignant neoplasm of sigmoid colon: Secondary | ICD-10-CM | POA: Diagnosis not present

## 2022-01-06 DIAGNOSIS — C187 Malignant neoplasm of sigmoid colon: Secondary | ICD-10-CM | POA: Diagnosis not present

## 2022-01-10 DIAGNOSIS — K5792 Diverticulitis of intestine, part unspecified, without perforation or abscess without bleeding: Secondary | ICD-10-CM | POA: Diagnosis not present

## 2022-01-10 DIAGNOSIS — Z5111 Encounter for antineoplastic chemotherapy: Secondary | ICD-10-CM | POA: Diagnosis not present

## 2022-01-10 DIAGNOSIS — K6389 Other specified diseases of intestine: Secondary | ICD-10-CM | POA: Diagnosis not present

## 2022-01-10 DIAGNOSIS — C187 Malignant neoplasm of sigmoid colon: Secondary | ICD-10-CM | POA: Diagnosis not present

## 2022-01-10 DIAGNOSIS — K631 Perforation of intestine (nontraumatic): Secondary | ICD-10-CM | POA: Diagnosis not present

## 2022-01-11 DIAGNOSIS — Z5111 Encounter for antineoplastic chemotherapy: Secondary | ICD-10-CM | POA: Diagnosis not present

## 2022-01-11 DIAGNOSIS — C187 Malignant neoplasm of sigmoid colon: Secondary | ICD-10-CM | POA: Diagnosis not present

## 2022-01-11 DIAGNOSIS — Z5112 Encounter for antineoplastic immunotherapy: Secondary | ICD-10-CM | POA: Diagnosis not present

## 2022-01-13 DIAGNOSIS — L03311 Cellulitis of abdominal wall: Secondary | ICD-10-CM | POA: Diagnosis not present

## 2022-01-13 DIAGNOSIS — C187 Malignant neoplasm of sigmoid colon: Secondary | ICD-10-CM | POA: Diagnosis not present

## 2022-01-19 DIAGNOSIS — R1904 Left lower quadrant abdominal swelling, mass and lump: Secondary | ICD-10-CM | POA: Diagnosis not present

## 2022-01-19 DIAGNOSIS — C187 Malignant neoplasm of sigmoid colon: Secondary | ICD-10-CM | POA: Diagnosis not present

## 2022-01-19 DIAGNOSIS — L03311 Cellulitis of abdominal wall: Secondary | ICD-10-CM | POA: Diagnosis not present

## 2022-01-19 DIAGNOSIS — I700 Atherosclerosis of aorta: Secondary | ICD-10-CM | POA: Diagnosis not present

## 2022-01-19 DIAGNOSIS — K3189 Other diseases of stomach and duodenum: Secondary | ICD-10-CM | POA: Diagnosis not present

## 2022-01-20 DIAGNOSIS — Z8616 Personal history of COVID-19: Secondary | ICD-10-CM | POA: Diagnosis not present

## 2022-01-20 DIAGNOSIS — K9402 Colostomy infection: Secondary | ICD-10-CM | POA: Diagnosis not present

## 2022-01-20 DIAGNOSIS — K5720 Diverticulitis of large intestine with perforation and abscess without bleeding: Secondary | ICD-10-CM | POA: Diagnosis not present

## 2022-01-20 DIAGNOSIS — C187 Malignant neoplasm of sigmoid colon: Secondary | ICD-10-CM | POA: Diagnosis not present

## 2022-01-20 DIAGNOSIS — Z933 Colostomy status: Secondary | ICD-10-CM | POA: Diagnosis not present

## 2022-01-20 DIAGNOSIS — L02211 Cutaneous abscess of abdominal wall: Secondary | ICD-10-CM | POA: Diagnosis not present

## 2022-01-20 DIAGNOSIS — Z20822 Contact with and (suspected) exposure to covid-19: Secondary | ICD-10-CM | POA: Diagnosis not present

## 2022-01-20 DIAGNOSIS — K435 Parastomal hernia without obstruction or  gangrene: Secondary | ICD-10-CM | POA: Diagnosis not present

## 2022-01-20 DIAGNOSIS — Z882 Allergy status to sulfonamides status: Secondary | ICD-10-CM | POA: Diagnosis not present

## 2022-01-20 DIAGNOSIS — C772 Secondary and unspecified malignant neoplasm of intra-abdominal lymph nodes: Secondary | ICD-10-CM | POA: Diagnosis not present

## 2022-01-25 DIAGNOSIS — K5792 Diverticulitis of intestine, part unspecified, without perforation or abscess without bleeding: Secondary | ICD-10-CM | POA: Diagnosis not present

## 2022-01-25 DIAGNOSIS — C187 Malignant neoplasm of sigmoid colon: Secondary | ICD-10-CM | POA: Diagnosis not present

## 2022-01-25 DIAGNOSIS — K5780 Diverticulitis of intestine, part unspecified, with perforation and abscess without bleeding: Secondary | ICD-10-CM | POA: Diagnosis not present

## 2022-01-26 DIAGNOSIS — K5780 Diverticulitis of intestine, part unspecified, with perforation and abscess without bleeding: Secondary | ICD-10-CM | POA: Diagnosis not present

## 2022-01-26 DIAGNOSIS — K631 Perforation of intestine (nontraumatic): Secondary | ICD-10-CM | POA: Diagnosis not present

## 2022-01-26 DIAGNOSIS — C187 Malignant neoplasm of sigmoid colon: Secondary | ICD-10-CM | POA: Diagnosis not present

## 2022-01-26 DIAGNOSIS — K5792 Diverticulitis of intestine, part unspecified, without perforation or abscess without bleeding: Secondary | ICD-10-CM | POA: Diagnosis not present

## 2022-01-26 DIAGNOSIS — Z933 Colostomy status: Secondary | ICD-10-CM | POA: Diagnosis not present

## 2022-01-27 DIAGNOSIS — C187 Malignant neoplasm of sigmoid colon: Secondary | ICD-10-CM | POA: Diagnosis not present

## 2022-01-27 DIAGNOSIS — K5780 Diverticulitis of intestine, part unspecified, with perforation and abscess without bleeding: Secondary | ICD-10-CM | POA: Diagnosis not present

## 2022-01-27 DIAGNOSIS — K5792 Diverticulitis of intestine, part unspecified, without perforation or abscess without bleeding: Secondary | ICD-10-CM | POA: Diagnosis not present

## 2022-01-28 DIAGNOSIS — K5792 Diverticulitis of intestine, part unspecified, without perforation or abscess without bleeding: Secondary | ICD-10-CM | POA: Diagnosis not present

## 2022-01-28 DIAGNOSIS — K5780 Diverticulitis of intestine, part unspecified, with perforation and abscess without bleeding: Secondary | ICD-10-CM | POA: Diagnosis not present

## 2022-01-28 DIAGNOSIS — C187 Malignant neoplasm of sigmoid colon: Secondary | ICD-10-CM | POA: Diagnosis not present

## 2022-01-29 DIAGNOSIS — C187 Malignant neoplasm of sigmoid colon: Secondary | ICD-10-CM | POA: Diagnosis not present

## 2022-01-29 DIAGNOSIS — K5720 Diverticulitis of large intestine with perforation and abscess without bleeding: Secondary | ICD-10-CM | POA: Diagnosis not present

## 2022-01-30 DIAGNOSIS — K5720 Diverticulitis of large intestine with perforation and abscess without bleeding: Secondary | ICD-10-CM | POA: Diagnosis not present

## 2022-01-30 DIAGNOSIS — C187 Malignant neoplasm of sigmoid colon: Secondary | ICD-10-CM | POA: Diagnosis not present

## 2022-01-31 DIAGNOSIS — K5792 Diverticulitis of intestine, part unspecified, without perforation or abscess without bleeding: Secondary | ICD-10-CM | POA: Diagnosis not present

## 2022-01-31 DIAGNOSIS — C187 Malignant neoplasm of sigmoid colon: Secondary | ICD-10-CM | POA: Diagnosis not present

## 2022-01-31 DIAGNOSIS — K5780 Diverticulitis of intestine, part unspecified, with perforation and abscess without bleeding: Secondary | ICD-10-CM | POA: Diagnosis not present

## 2022-02-01 DIAGNOSIS — K5780 Diverticulitis of intestine, part unspecified, with perforation and abscess without bleeding: Secondary | ICD-10-CM | POA: Diagnosis not present

## 2022-02-01 DIAGNOSIS — C187 Malignant neoplasm of sigmoid colon: Secondary | ICD-10-CM | POA: Diagnosis not present

## 2022-02-01 DIAGNOSIS — K5792 Diverticulitis of intestine, part unspecified, without perforation or abscess without bleeding: Secondary | ICD-10-CM | POA: Diagnosis not present

## 2022-02-02 DIAGNOSIS — K5792 Diverticulitis of intestine, part unspecified, without perforation or abscess without bleeding: Secondary | ICD-10-CM | POA: Diagnosis not present

## 2022-02-02 DIAGNOSIS — K5720 Diverticulitis of large intestine with perforation and abscess without bleeding: Secondary | ICD-10-CM | POA: Diagnosis not present

## 2022-02-02 DIAGNOSIS — C187 Malignant neoplasm of sigmoid colon: Secondary | ICD-10-CM | POA: Diagnosis not present

## 2022-02-02 DIAGNOSIS — K5780 Diverticulitis of intestine, part unspecified, with perforation and abscess without bleeding: Secondary | ICD-10-CM | POA: Diagnosis not present

## 2022-02-02 DIAGNOSIS — N83209 Unspecified ovarian cyst, unspecified side: Secondary | ICD-10-CM | POA: Diagnosis not present

## 2022-02-03 DIAGNOSIS — K5792 Diverticulitis of intestine, part unspecified, without perforation or abscess without bleeding: Secondary | ICD-10-CM | POA: Diagnosis not present

## 2022-02-03 DIAGNOSIS — C187 Malignant neoplasm of sigmoid colon: Secondary | ICD-10-CM | POA: Diagnosis not present

## 2022-02-03 DIAGNOSIS — K5780 Diverticulitis of intestine, part unspecified, with perforation and abscess without bleeding: Secondary | ICD-10-CM | POA: Diagnosis not present

## 2022-02-04 DIAGNOSIS — C187 Malignant neoplasm of sigmoid colon: Secondary | ICD-10-CM | POA: Diagnosis not present

## 2022-02-04 DIAGNOSIS — K5792 Diverticulitis of intestine, part unspecified, without perforation or abscess without bleeding: Secondary | ICD-10-CM | POA: Diagnosis not present

## 2022-02-04 DIAGNOSIS — K5780 Diverticulitis of intestine, part unspecified, with perforation and abscess without bleeding: Secondary | ICD-10-CM | POA: Diagnosis not present

## 2022-02-08 DIAGNOSIS — N83209 Unspecified ovarian cyst, unspecified side: Secondary | ICD-10-CM | POA: Diagnosis not present

## 2022-02-08 DIAGNOSIS — C187 Malignant neoplasm of sigmoid colon: Secondary | ICD-10-CM | POA: Diagnosis not present

## 2022-02-08 DIAGNOSIS — K631 Perforation of intestine (nontraumatic): Secondary | ICD-10-CM | POA: Diagnosis not present

## 2022-02-08 DIAGNOSIS — D259 Leiomyoma of uterus, unspecified: Secondary | ICD-10-CM | POA: Diagnosis not present

## 2022-02-08 DIAGNOSIS — D261 Other benign neoplasm of corpus uteri: Secondary | ICD-10-CM | POA: Diagnosis not present

## 2022-02-08 DIAGNOSIS — K5720 Diverticulitis of large intestine with perforation and abscess without bleeding: Secondary | ICD-10-CM | POA: Diagnosis not present

## 2022-02-08 DIAGNOSIS — K6389 Other specified diseases of intestine: Secondary | ICD-10-CM | POA: Diagnosis not present

## 2022-02-08 DIAGNOSIS — Z5111 Encounter for antineoplastic chemotherapy: Secondary | ICD-10-CM | POA: Diagnosis not present

## 2022-02-08 DIAGNOSIS — R9389 Abnormal findings on diagnostic imaging of other specified body structures: Secondary | ICD-10-CM | POA: Diagnosis not present

## 2022-02-08 DIAGNOSIS — N83201 Unspecified ovarian cyst, right side: Secondary | ICD-10-CM | POA: Diagnosis not present

## 2022-02-09 DIAGNOSIS — K9409 Other complications of colostomy: Secondary | ICD-10-CM | POA: Diagnosis not present

## 2022-02-09 DIAGNOSIS — I700 Atherosclerosis of aorta: Secondary | ICD-10-CM | POA: Diagnosis not present

## 2022-02-09 DIAGNOSIS — K5792 Diverticulitis of intestine, part unspecified, without perforation or abscess without bleeding: Secondary | ICD-10-CM | POA: Diagnosis not present

## 2022-02-09 DIAGNOSIS — K435 Parastomal hernia without obstruction or  gangrene: Secondary | ICD-10-CM | POA: Diagnosis not present

## 2022-02-09 DIAGNOSIS — C189 Malignant neoplasm of colon, unspecified: Secondary | ICD-10-CM | POA: Diagnosis not present

## 2022-02-09 DIAGNOSIS — K769 Liver disease, unspecified: Secondary | ICD-10-CM | POA: Diagnosis not present

## 2022-02-09 DIAGNOSIS — I517 Cardiomegaly: Secondary | ICD-10-CM | POA: Diagnosis not present

## 2022-02-09 DIAGNOSIS — Z872 Personal history of diseases of the skin and subcutaneous tissue: Secondary | ICD-10-CM | POA: Diagnosis not present

## 2022-02-09 DIAGNOSIS — K439 Ventral hernia without obstruction or gangrene: Secondary | ICD-10-CM | POA: Diagnosis not present

## 2022-02-09 DIAGNOSIS — K5720 Diverticulitis of large intestine with perforation and abscess without bleeding: Secondary | ICD-10-CM | POA: Diagnosis not present

## 2022-02-09 DIAGNOSIS — N750 Cyst of Bartholin's gland: Secondary | ICD-10-CM | POA: Diagnosis not present

## 2022-02-09 DIAGNOSIS — Z933 Colostomy status: Secondary | ICD-10-CM | POA: Diagnosis not present

## 2022-02-09 DIAGNOSIS — K429 Umbilical hernia without obstruction or gangrene: Secondary | ICD-10-CM | POA: Diagnosis not present

## 2022-02-09 DIAGNOSIS — K631 Perforation of intestine (nontraumatic): Secondary | ICD-10-CM | POA: Diagnosis not present

## 2022-02-09 DIAGNOSIS — C187 Malignant neoplasm of sigmoid colon: Secondary | ICD-10-CM | POA: Diagnosis not present

## 2022-02-09 DIAGNOSIS — D259 Leiomyoma of uterus, unspecified: Secondary | ICD-10-CM | POA: Diagnosis not present

## 2022-02-10 DIAGNOSIS — C187 Malignant neoplasm of sigmoid colon: Secondary | ICD-10-CM | POA: Diagnosis not present

## 2022-02-10 DIAGNOSIS — Z8719 Personal history of other diseases of the digestive system: Secondary | ICD-10-CM | POA: Diagnosis not present

## 2022-02-22 DIAGNOSIS — C187 Malignant neoplasm of sigmoid colon: Secondary | ICD-10-CM | POA: Diagnosis not present

## 2022-02-22 DIAGNOSIS — Z5111 Encounter for antineoplastic chemotherapy: Secondary | ICD-10-CM | POA: Diagnosis not present

## 2022-02-22 DIAGNOSIS — K5720 Diverticulitis of large intestine with perforation and abscess without bleeding: Secondary | ICD-10-CM | POA: Diagnosis not present

## 2022-02-22 DIAGNOSIS — Z933 Colostomy status: Secondary | ICD-10-CM | POA: Diagnosis not present

## 2022-02-22 DIAGNOSIS — Z6822 Body mass index (BMI) 22.0-22.9, adult: Secondary | ICD-10-CM | POA: Diagnosis not present

## 2022-02-22 DIAGNOSIS — K631 Perforation of intestine (nontraumatic): Secondary | ICD-10-CM | POA: Diagnosis not present

## 2022-02-23 DIAGNOSIS — Z5112 Encounter for antineoplastic immunotherapy: Secondary | ICD-10-CM | POA: Diagnosis not present

## 2022-02-23 DIAGNOSIS — Z5111 Encounter for antineoplastic chemotherapy: Secondary | ICD-10-CM | POA: Diagnosis not present

## 2022-02-23 DIAGNOSIS — C187 Malignant neoplasm of sigmoid colon: Secondary | ICD-10-CM | POA: Diagnosis not present

## 2022-02-25 DIAGNOSIS — C187 Malignant neoplasm of sigmoid colon: Secondary | ICD-10-CM | POA: Diagnosis not present

## 2022-02-25 DIAGNOSIS — Z452 Encounter for adjustment and management of vascular access device: Secondary | ICD-10-CM | POA: Diagnosis not present

## 2022-02-28 DIAGNOSIS — Z8249 Family history of ischemic heart disease and other diseases of the circulatory system: Secondary | ICD-10-CM | POA: Diagnosis not present

## 2022-02-28 DIAGNOSIS — Z933 Colostomy status: Secondary | ICD-10-CM | POA: Diagnosis not present

## 2022-02-28 DIAGNOSIS — R32 Unspecified urinary incontinence: Secondary | ICD-10-CM | POA: Diagnosis not present

## 2022-02-28 DIAGNOSIS — Z833 Family history of diabetes mellitus: Secondary | ICD-10-CM | POA: Diagnosis not present

## 2022-02-28 DIAGNOSIS — D84821 Immunodeficiency due to drugs: Secondary | ICD-10-CM | POA: Diagnosis not present

## 2022-02-28 DIAGNOSIS — Z008 Encounter for other general examination: Secondary | ICD-10-CM | POA: Diagnosis not present

## 2022-02-28 DIAGNOSIS — C779 Secondary and unspecified malignant neoplasm of lymph node, unspecified: Secondary | ICD-10-CM | POA: Diagnosis not present

## 2022-02-28 DIAGNOSIS — Z801 Family history of malignant neoplasm of trachea, bronchus and lung: Secondary | ICD-10-CM | POA: Diagnosis not present

## 2022-02-28 DIAGNOSIS — Z880 Allergy status to penicillin: Secondary | ICD-10-CM | POA: Diagnosis not present

## 2022-02-28 DIAGNOSIS — R030 Elevated blood-pressure reading, without diagnosis of hypertension: Secondary | ICD-10-CM | POA: Diagnosis not present

## 2022-02-28 DIAGNOSIS — C189 Malignant neoplasm of colon, unspecified: Secondary | ICD-10-CM | POA: Diagnosis not present

## 2022-02-28 DIAGNOSIS — K219 Gastro-esophageal reflux disease without esophagitis: Secondary | ICD-10-CM | POA: Diagnosis not present

## 2022-02-28 DIAGNOSIS — Z882 Allergy status to sulfonamides status: Secondary | ICD-10-CM | POA: Diagnosis not present

## 2022-03-06 IMAGING — PT NM PET TUM IMG INITIAL (PI) SKULL BASE T - THIGH
1 series · 6 of 6 positions shown · non-contrast
Comparison: 09/02/2021 abdominopelvic CT from [HOSPITAL] [facility]. Chest
CT 10/25/2021 from [HOSPITAL] [facility].

CLINICAL DATA: Initial treatment strategy for new diagnosis of
colon cancer. Sigmoid primary.

EXAM:
NUCLEAR MEDICINE PET SKULL BASE TO THIGH
TECHNIQUE: 7.4 mCi F-18 FDG was injected intravenously. Full-ring PET imaging
was performed from the skull base to thigh after the radiotracer. CT
data was obtained and used for attenuation correction and anatomic
localization.
Fasting blood glucose: 106 mg/dl

[Series 1040: results mm oncology reading · 3.0mm · 0.94mm/px · 6 of 6 slices shown]
[im 1/6]
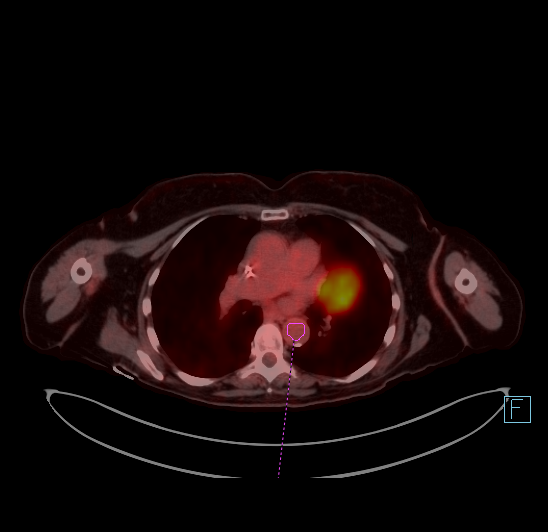
[im 2/6]
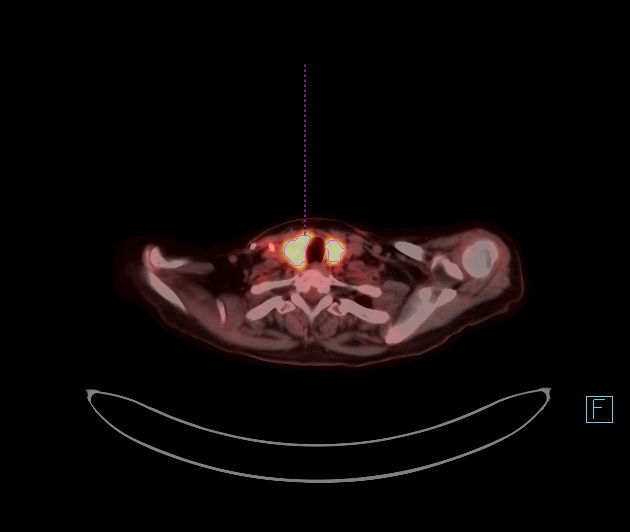
[im 3/6]
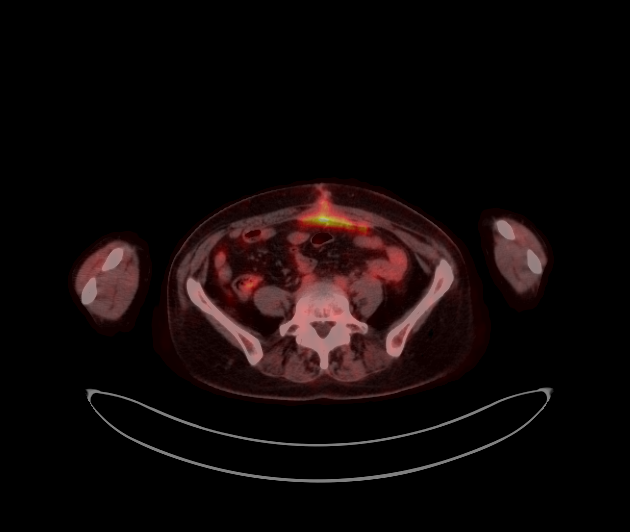
[im 4/6]
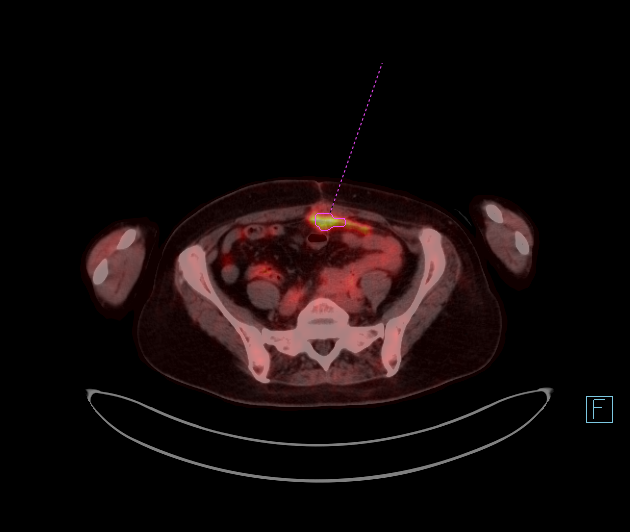
[im 5/6]
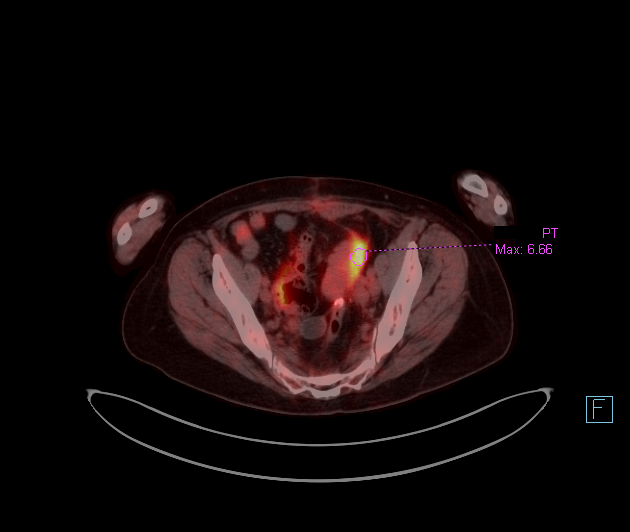
[im 6/6]
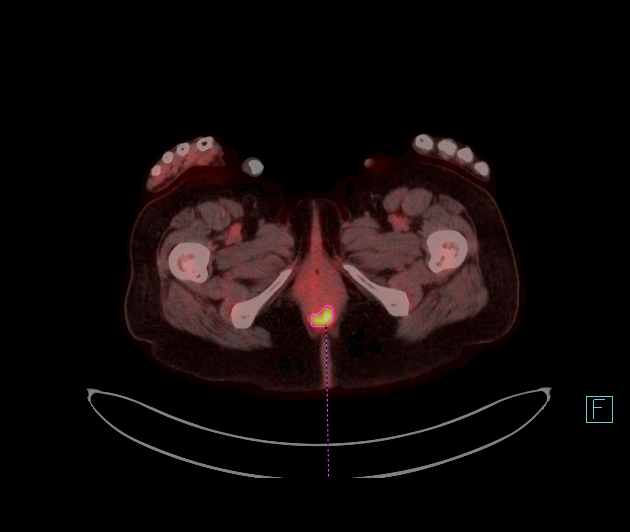

[6 of 6 positions shown; findings below may reference images not displayed]

FINDINGS: Mediastinal blood pool activity: SUV max

Liver activity: SUV max NA

NECK: No cervical nodal hypermetabolism. Diffuse thyroid
hypermetabolism, without dominant mass. Example at a S.U.V. max of
11.4.

Incidental CT findings: No cervical adenopathy.

CHEST: No pulmonary parenchymal or thoracic nodal hypermetabolism.

Incidental CT findings: Right Port-A-Cath tip at high right atrium.
Small hiatal hernia. Aortic atherosclerosis.

ABDOMEN/PELVIS: No abdominopelvic parenchymal or nodal
hypermetabolism. Anorectal hypermetabolism is without CT correlate
and most likely physiologic. Example at a S.U.V. max of 6.0.

Interval descending colostomy with Hartmann's pouch.

Along the anterior left paramidline pelvic peritoneum is smooth soft
tissue thickening and correlate hypermetabolism. Example at a S.U.V.
max of 5.8 on 189/3. This continues along the left pelvic wall,
contiguous with the superior aspect of the uterus and pelvic small
bowel loops. Example at a S.U.V. max of 6.7 on 208/3.

Incidental CT findings: Normal adrenal glands. Segment 4B hepatic
cyst. Normal noncontrast appearance of the pancreas, kidneys.
Descending duodenal diverticulum. Small fat containing parastomal
hernia. Right ovarian 2.0 cm residual follicle or cyst.

SKELETON: No abnormal marrow activity.

Incidental CT findings: none
IMPRESSION: 1. Since the 09/02/2021 CT, creation of a descending colostomy and
Hartmann's pouch. Anterior and left-sided peritoneal smooth soft
tissue thickening with hypermetabolism. Although the appearance is
nonspecific in the subacute postoperative state, peritoneal
metastasis are a concern. Consider CT follow-up at 3 months with
special attention to these areas.
2. Otherwise, no hypermetabolic metastatic disease identified.
3. Diffuse thyroid hypermetabolism can be seen with thyroiditis.
Consider correlation with thyroid function tests.
4. Incidental findings, including: Small hiatal hernia. Aortic
Atherosclerosis ([id number]).

## 2022-03-07 DIAGNOSIS — C187 Malignant neoplasm of sigmoid colon: Secondary | ICD-10-CM | POA: Diagnosis not present

## 2022-03-07 DIAGNOSIS — Z5111 Encounter for antineoplastic chemotherapy: Secondary | ICD-10-CM | POA: Diagnosis not present

## 2022-03-07 DIAGNOSIS — K6389 Other specified diseases of intestine: Secondary | ICD-10-CM | POA: Diagnosis not present

## 2022-03-07 DIAGNOSIS — Z933 Colostomy status: Secondary | ICD-10-CM | POA: Diagnosis not present

## 2022-03-07 DIAGNOSIS — K631 Perforation of intestine (nontraumatic): Secondary | ICD-10-CM | POA: Diagnosis not present

## 2022-03-07 DIAGNOSIS — K5720 Diverticulitis of large intestine with perforation and abscess without bleeding: Secondary | ICD-10-CM | POA: Diagnosis not present

## 2022-03-09 DIAGNOSIS — Z5112 Encounter for antineoplastic immunotherapy: Secondary | ICD-10-CM | POA: Diagnosis not present

## 2022-03-09 DIAGNOSIS — K5720 Diverticulitis of large intestine with perforation and abscess without bleeding: Secondary | ICD-10-CM | POA: Diagnosis not present

## 2022-03-09 DIAGNOSIS — C187 Malignant neoplasm of sigmoid colon: Secondary | ICD-10-CM | POA: Diagnosis not present

## 2022-03-09 DIAGNOSIS — Z5111 Encounter for antineoplastic chemotherapy: Secondary | ICD-10-CM | POA: Diagnosis not present

## 2022-03-11 DIAGNOSIS — C187 Malignant neoplasm of sigmoid colon: Secondary | ICD-10-CM | POA: Diagnosis not present

## 2022-03-21 DIAGNOSIS — Z5111 Encounter for antineoplastic chemotherapy: Secondary | ICD-10-CM | POA: Diagnosis not present

## 2022-03-21 DIAGNOSIS — K5720 Diverticulitis of large intestine with perforation and abscess without bleeding: Secondary | ICD-10-CM | POA: Diagnosis not present

## 2022-03-21 DIAGNOSIS — C187 Malignant neoplasm of sigmoid colon: Secondary | ICD-10-CM | POA: Diagnosis not present

## 2022-03-21 DIAGNOSIS — K6389 Other specified diseases of intestine: Secondary | ICD-10-CM | POA: Diagnosis not present

## 2022-03-21 DIAGNOSIS — K631 Perforation of intestine (nontraumatic): Secondary | ICD-10-CM | POA: Diagnosis not present

## 2022-03-23 DIAGNOSIS — Z808 Family history of malignant neoplasm of other organs or systems: Secondary | ICD-10-CM | POA: Diagnosis not present

## 2022-03-23 DIAGNOSIS — Z933 Colostomy status: Secondary | ICD-10-CM | POA: Diagnosis not present

## 2022-03-23 DIAGNOSIS — Z5112 Encounter for antineoplastic immunotherapy: Secondary | ICD-10-CM | POA: Diagnosis not present

## 2022-03-23 DIAGNOSIS — C187 Malignant neoplasm of sigmoid colon: Secondary | ICD-10-CM | POA: Diagnosis not present

## 2022-03-23 DIAGNOSIS — Z5111 Encounter for antineoplastic chemotherapy: Secondary | ICD-10-CM | POA: Diagnosis not present

## 2022-03-23 DIAGNOSIS — Z803 Family history of malignant neoplasm of breast: Secondary | ICD-10-CM | POA: Diagnosis not present

## 2022-03-23 DIAGNOSIS — Z882 Allergy status to sulfonamides status: Secondary | ICD-10-CM | POA: Diagnosis not present

## 2022-03-23 DIAGNOSIS — Z8042 Family history of malignant neoplasm of prostate: Secondary | ICD-10-CM | POA: Diagnosis not present

## 2022-03-25 DIAGNOSIS — C187 Malignant neoplasm of sigmoid colon: Secondary | ICD-10-CM | POA: Diagnosis not present

## 2022-03-25 DIAGNOSIS — Z452 Encounter for adjustment and management of vascular access device: Secondary | ICD-10-CM | POA: Diagnosis not present

## 2022-03-28 DIAGNOSIS — K5720 Diverticulitis of large intestine with perforation and abscess without bleeding: Secondary | ICD-10-CM | POA: Diagnosis not present

## 2022-03-28 DIAGNOSIS — C187 Malignant neoplasm of sigmoid colon: Secondary | ICD-10-CM | POA: Diagnosis not present

## 2022-04-06 DIAGNOSIS — K629 Disease of anus and rectum, unspecified: Secondary | ICD-10-CM | POA: Diagnosis not present

## 2022-04-06 DIAGNOSIS — K435 Parastomal hernia without obstruction or  gangrene: Secondary | ICD-10-CM | POA: Diagnosis not present

## 2022-04-06 DIAGNOSIS — C187 Malignant neoplasm of sigmoid colon: Secondary | ICD-10-CM | POA: Diagnosis not present

## 2022-04-06 DIAGNOSIS — E079 Disorder of thyroid, unspecified: Secondary | ICD-10-CM | POA: Diagnosis not present

## 2022-04-07 DIAGNOSIS — C187 Malignant neoplasm of sigmoid colon: Secondary | ICD-10-CM | POA: Diagnosis not present

## 2022-04-11 DIAGNOSIS — K5720 Diverticulitis of large intestine with perforation and abscess without bleeding: Secondary | ICD-10-CM | POA: Diagnosis not present

## 2022-04-11 DIAGNOSIS — Z933 Colostomy status: Secondary | ICD-10-CM | POA: Diagnosis not present

## 2022-04-11 DIAGNOSIS — K6389 Other specified diseases of intestine: Secondary | ICD-10-CM | POA: Diagnosis not present

## 2022-04-11 DIAGNOSIS — K631 Perforation of intestine (nontraumatic): Secondary | ICD-10-CM | POA: Diagnosis not present

## 2022-04-11 DIAGNOSIS — T451X5A Adverse effect of antineoplastic and immunosuppressive drugs, initial encounter: Secondary | ICD-10-CM | POA: Diagnosis not present

## 2022-04-11 DIAGNOSIS — E876 Hypokalemia: Secondary | ICD-10-CM | POA: Diagnosis not present

## 2022-04-11 DIAGNOSIS — C187 Malignant neoplasm of sigmoid colon: Secondary | ICD-10-CM | POA: Diagnosis not present

## 2022-04-11 DIAGNOSIS — G620 Drug-induced polyneuropathy: Secondary | ICD-10-CM | POA: Diagnosis not present

## 2022-04-11 DIAGNOSIS — D701 Agranulocytosis secondary to cancer chemotherapy: Secondary | ICD-10-CM | POA: Diagnosis not present

## 2022-04-11 DIAGNOSIS — Z5111 Encounter for antineoplastic chemotherapy: Secondary | ICD-10-CM | POA: Diagnosis not present

## 2022-04-18 DIAGNOSIS — C187 Malignant neoplasm of sigmoid colon: Secondary | ICD-10-CM | POA: Diagnosis not present

## 2022-04-18 DIAGNOSIS — Z5111 Encounter for antineoplastic chemotherapy: Secondary | ICD-10-CM | POA: Diagnosis not present

## 2022-04-20 DIAGNOSIS — Z5111 Encounter for antineoplastic chemotherapy: Secondary | ICD-10-CM | POA: Diagnosis not present

## 2022-04-20 DIAGNOSIS — C187 Malignant neoplasm of sigmoid colon: Secondary | ICD-10-CM | POA: Diagnosis not present

## 2022-04-20 DIAGNOSIS — Z5112 Encounter for antineoplastic immunotherapy: Secondary | ICD-10-CM | POA: Diagnosis not present

## 2022-04-22 DIAGNOSIS — C187 Malignant neoplasm of sigmoid colon: Secondary | ICD-10-CM | POA: Diagnosis not present

## 2022-05-02 DIAGNOSIS — K5720 Diverticulitis of large intestine with perforation and abscess without bleeding: Secondary | ICD-10-CM | POA: Diagnosis not present

## 2022-05-02 DIAGNOSIS — Z5111 Encounter for antineoplastic chemotherapy: Secondary | ICD-10-CM | POA: Diagnosis not present

## 2022-05-02 DIAGNOSIS — C187 Malignant neoplasm of sigmoid colon: Secondary | ICD-10-CM | POA: Diagnosis not present

## 2022-05-04 DIAGNOSIS — Z5112 Encounter for antineoplastic immunotherapy: Secondary | ICD-10-CM | POA: Diagnosis not present

## 2022-05-04 DIAGNOSIS — E876 Hypokalemia: Secondary | ICD-10-CM | POA: Diagnosis not present

## 2022-05-04 DIAGNOSIS — G620 Drug-induced polyneuropathy: Secondary | ICD-10-CM | POA: Diagnosis not present

## 2022-05-04 DIAGNOSIS — T451X5A Adverse effect of antineoplastic and immunosuppressive drugs, initial encounter: Secondary | ICD-10-CM | POA: Diagnosis not present

## 2022-05-04 DIAGNOSIS — Z933 Colostomy status: Secondary | ICD-10-CM | POA: Diagnosis not present

## 2022-05-04 DIAGNOSIS — Z79899 Other long term (current) drug therapy: Secondary | ICD-10-CM | POA: Diagnosis not present

## 2022-05-04 DIAGNOSIS — C187 Malignant neoplasm of sigmoid colon: Secondary | ICD-10-CM | POA: Diagnosis not present

## 2022-05-04 DIAGNOSIS — D701 Agranulocytosis secondary to cancer chemotherapy: Secondary | ICD-10-CM | POA: Diagnosis not present

## 2022-05-04 DIAGNOSIS — Z5111 Encounter for antineoplastic chemotherapy: Secondary | ICD-10-CM | POA: Diagnosis not present

## 2022-05-04 DIAGNOSIS — T451X5D Adverse effect of antineoplastic and immunosuppressive drugs, subsequent encounter: Secondary | ICD-10-CM | POA: Diagnosis not present

## 2022-05-06 DIAGNOSIS — C187 Malignant neoplasm of sigmoid colon: Secondary | ICD-10-CM | POA: Diagnosis not present

## 2022-05-06 DIAGNOSIS — K5720 Diverticulitis of large intestine with perforation and abscess without bleeding: Secondary | ICD-10-CM | POA: Diagnosis not present

## 2022-05-16 DIAGNOSIS — Z5111 Encounter for antineoplastic chemotherapy: Secondary | ICD-10-CM | POA: Diagnosis not present

## 2022-05-16 DIAGNOSIS — C187 Malignant neoplasm of sigmoid colon: Secondary | ICD-10-CM | POA: Diagnosis not present

## 2022-05-16 DIAGNOSIS — K631 Perforation of intestine (nontraumatic): Secondary | ICD-10-CM | POA: Diagnosis not present

## 2022-05-18 DIAGNOSIS — R002 Palpitations: Secondary | ICD-10-CM | POA: Diagnosis not present

## 2022-05-18 DIAGNOSIS — C187 Malignant neoplasm of sigmoid colon: Secondary | ICD-10-CM | POA: Diagnosis not present

## 2022-05-18 DIAGNOSIS — G620 Drug-induced polyneuropathy: Secondary | ICD-10-CM | POA: Diagnosis not present

## 2022-05-18 DIAGNOSIS — Z933 Colostomy status: Secondary | ICD-10-CM | POA: Diagnosis not present

## 2022-05-18 DIAGNOSIS — E876 Hypokalemia: Secondary | ICD-10-CM | POA: Diagnosis not present

## 2022-05-18 DIAGNOSIS — Z5111 Encounter for antineoplastic chemotherapy: Secondary | ICD-10-CM | POA: Diagnosis not present

## 2022-05-18 DIAGNOSIS — T451X5A Adverse effect of antineoplastic and immunosuppressive drugs, initial encounter: Secondary | ICD-10-CM | POA: Diagnosis not present

## 2022-05-18 DIAGNOSIS — D701 Agranulocytosis secondary to cancer chemotherapy: Secondary | ICD-10-CM | POA: Diagnosis not present

## 2022-05-24 DIAGNOSIS — K5720 Diverticulitis of large intestine with perforation and abscess without bleeding: Secondary | ICD-10-CM | POA: Diagnosis not present

## 2022-05-24 DIAGNOSIS — I427 Cardiomyopathy due to drug and external agent: Secondary | ICD-10-CM | POA: Diagnosis not present

## 2022-05-24 DIAGNOSIS — C187 Malignant neoplasm of sigmoid colon: Secondary | ICD-10-CM | POA: Diagnosis not present

## 2022-05-24 DIAGNOSIS — Z5111 Encounter for antineoplastic chemotherapy: Secondary | ICD-10-CM | POA: Diagnosis not present

## 2022-05-24 DIAGNOSIS — K6389 Other specified diseases of intestine: Secondary | ICD-10-CM | POA: Diagnosis not present

## 2022-05-24 DIAGNOSIS — K631 Perforation of intestine (nontraumatic): Secondary | ICD-10-CM | POA: Diagnosis not present

## 2022-06-16 NOTE — Progress Notes (Unsigned)
Cardiology Office Note:    Date:  06/16/2022   ID:  Vanessa Mcclure, DOB 08-Jun-1956, MRN [id number]  PCP:  Skillman, Katherine E, [place] Providers Cardiologist:  None   Referring MD: Rosalee Kaufman, *    History of Present Illness:    Vanessa Mcclure is a 66 y.o. female with a hx of GERD, aortic atherosclerosis and adenocarcinoma of the sigmoid colon who was referred by Clemmie Krill, PA for further evaluation of chest pain and palpitations.  Patient was seen by Ms. Skillman on 05/19/22. Note reviewed. The patient was complaining of intermittent chest pain and palpitations prompting referral to Cardiology.  Today, ***  Past Medical History:  Diagnosis Date   Angina pectoris (HCC)    Aortic atherosclerosis (HCC)    Arthritis    Atherosclerotic heart disease    Colon cancer ([place])    COVID-19    GERD (gastroesophageal reflux disease)    Malignant neoplasm of sigmoid colon (HCC)    Numbness in feet    Pain in both feet    Palpitations    SOB (shortness of breath)     Past Surgical History:  Procedure Laterality Date   BOWEL RESECTION     COLON SURGERY     ECTOPIC PREGNANCY SURGERY  1980   INCISION AND DRAINAGE ABSCESS      Current Medications: No outpatient medications have been marked as taking for the 06/21/22 encounter (Appointment) with Freada Bergeron, MD.     Allergies:   Zosyn [piperacillin sod-tazobactam so] and Sulfa antibiotics   Social History   Socioeconomic History   Marital status: Married    Spouse name: Not on file   Number of children: Not on file   Years of education: Not on file   Highest education level: Not on file  Occupational History   Not on file  Tobacco Use   Smoking status: Never   Smokeless tobacco: Never  Substance and Sexual Activity   Alcohol use: Never   Drug use: Never   Sexual activity: Not on file  Other Topics Concern   Not on file  Social History Narrative   Not on file    Social Determinants of Health   Financial Resource Strain: Not on file  Food Insecurity: Not on file  Transportation Needs: Not on file  Physical Activity: Not on file  Stress: Not on file  Social Connections: Not on file     Family History: The patient's ***family history includes Cancer in her brother and father; Diabetes in her mother; Heart disease in her father and mother.  ROS:   Please see the history of present illness.    *** All other systems reviewed and are negative.  EKGs/Labs/Other Studies Reviewed:    The following studies were reviewed today: ***  EKG:  EKG is *** ordered today.  The ekg ordered today demonstrates ***  Recent Labs: No results found for requested labs within last 365 days.  Recent Lipid Panel No results found for: "CHOL", "TRIG", "HDL", "CHOLHDL", "VLDL", "LDLCALC", "LDLDIRECT"   Risk Assessment/Calculations:   {Does this patient have ATRIAL FIBRILLATION?:[phone]}  No BP recorded.  {Refresh Note OR Click here to enter BP  :1}***         Physical Exam:    VS:  There were no vitals taken for this visit.    Wt Readings from Last 3 Encounters:  05/03/21 138 lb (62.6 kg)     GEN: *** Well  nourished, well developed in no acute distress HEENT: Normal NECK: No JVD; No carotid bruits LYMPHATICS: No lymphadenopathy CARDIAC: ***RRR, no murmurs, rubs, gallops RESPIRATORY:  Clear to auscultation without rales, wheezing or rhonchi  ABDOMEN: Soft, non-tender, non-distended MUSCULOSKELETAL:  No edema; No deformity  SKIN: Warm and dry NEUROLOGIC:  Alert and oriented x 3 PSYCHIATRIC:  Normal affect   ASSESSMENT:    No diagnosis found. PLAN:    In order of problems listed above:  #Chest Pain: Patient reports episodes of chest heaviness radiating to her jar and down her arm. This initially occurred following treatment with 5-FU for her adenocarcinoma. These treatments were stopped in August and she continues to have intermittent  episodes of chest heaviness. These are not associated with exertion and usually abate after 5-2mn. Given symptoms, family history of CAD (mother, brother ((74s and father), will check coronary CTA and echo for further evaluation. -Check coronary CTA -Check TTE with strain  #Palpitations: Patient is having episodes of intermittent palpitations at home that occur about every other day occurring for 20-359m at a time. No known triggers but she admits that she drinks 3 sundrops per day which may be contributing. Kardia device strip brought today shows frequent PACs and PVCs but no Afib. Will interrogate further with 7 day zio for further evaluation. -Check zio  -Check TTE as above  #Aortic Atherosclerosis: Patient would like to await her cholesterol results prior to starting statin therapy.  -Check lipid panel and coronary CTA as above -Will cater statin dosing to LDL  #Adenocarcinoma of the Sigmoid Colon: Diagnosed in 08/2021 when she presented with bowel perforation. Currently stage IIIB. On 5-FU and oxaliplatin. [facility] -Follow-up with Heme as scheduled          Medication Adjustments/Labs and Tests Ordered: Current medicines are reviewed at length with the patient today.  Concerns regarding medicines are outlined above.  No orders of the defined types were placed in this encounter.  No orders of the defined types were placed in this encounter.   There are no Patient Instructions on file for this visit.   Signed, HeFreada BergeronMD  06/16/2022 1:48 PM    [place]

## 2022-06-21 ENCOUNTER — Encounter: Payer: Self-pay | Admitting: Cardiology

## 2022-06-21 ENCOUNTER — Ambulatory Visit: Payer: Medicare HMO | Attending: Cardiology

## 2022-06-21 ENCOUNTER — Telehealth: Payer: Self-pay | Admitting: *Deleted

## 2022-06-21 ENCOUNTER — Ambulatory Visit: Payer: Medicare HMO | Attending: Cardiology | Admitting: Cardiology

## 2022-06-21 VITALS — BP 128/72 | HR 64 | Ht 65.5 in | Wt 137.0 lb

## 2022-06-21 DIAGNOSIS — Z79899 Other long term (current) drug therapy: Secondary | ICD-10-CM | POA: Diagnosis not present

## 2022-06-21 DIAGNOSIS — R002 Palpitations: Secondary | ICD-10-CM

## 2022-06-21 DIAGNOSIS — Z0189 Encounter for other specified special examinations: Secondary | ICD-10-CM

## 2022-06-21 DIAGNOSIS — R072 Precordial pain: Secondary | ICD-10-CM | POA: Diagnosis not present

## 2022-06-21 MED ORDER — METOPROLOL TARTRATE 50 MG PO TABS: 50.0000 mg | ORAL_TABLET | Freq: Once | ORAL | 0 refills | Status: DC

## 2022-06-21 NOTE — Telephone Encounter (Signed)
-----   Message from Jennefer Bravo sent at 06/21/2022  9:26 AM EDT ----- Regarding: RE: 7 DAY ZIO done ----- Message ----- From: Nuala Alpha, LPN Sent: 09/24/5798   8:43 AM EDT To: Nuala Alpha, LPN; Shelly A Wells Subject: 7 DAY ZIO                                      Dr. Johney Frame ordered a 7 day zio for palpitations on this pt.   Please enroll and let me know when you do,.  Thanks [employer]

## 2022-06-21 NOTE — Progress Notes (Signed)
Cardiology Office Note:    Date:  06/21/2022   ID:  Vanessa Mcclure, DOB 30-Apr-1956, MRN [id number]  PCP:  Skillman, Katherine E, [facility] Providers Cardiologist:  None   Referring MD: Rosalee Kaufman, *    History of Present Illness:    Vanessa Mcclure is a 66 y.o. female with a hx of GERD, aortic atherosclerosis and adenocarcinoma of the sigmoid colon who was referred by Clemmie Krill, PA for further evaluation of chest pain and palpitations.  Patient was seen by Ms. Skillman on 05/19/22. Note reviewed. The patient was complaining of intermittent chest pain and palpitations prompting referral to Cardiology.  Today, she is accompanied by her husband. She complains of chest pain and palpitations that began after starting chemo for colon cancer. On two separate visits for surgery, she was told that her heart rate was low (in the 40's). She was not symptomatic at that time with no lightheadedness, dizziness or chest pain. She also states that after reviewing her prior ecgs (not in our system) there was concern that she may have had a heart attack. Given this, she was referred to Cardiology for further management.  Today, the patient complains of episodes of palpitations that lasts about 20-30 minutes every couple of days. She has not noticed any aggravating or alleviating factors. Symptoms resolve without intervention. Review of Kardia device strip shows PACs and PVCs.   Since stopping chemo, she has continued to experience chest pain described as "a little pressure deep in her chest" and lasting about 5-10 minutes. Her chest pain occurs randomly, such as while sitting. She denies any exertional chest pain. No significant SOB with activity but notes that she has had "less energy" and gets more wiped out than previously.  Her father was in his 50s when he had a CABG, and her mother was in her 39s when her CABG was performed. Her brother had stents placed, and  died of cancer in his 59s.  She denies any peripheral edema. No headaches, syncope, orthopnea, or PND.    Past Medical History:  Diagnosis Date   Angina pectoris (HCC)    Aortic atherosclerosis (HCC)    Arthritis    Atherosclerotic heart disease    Colon cancer ([place])    COVID-19    GERD (gastroesophageal reflux disease)    Malignant neoplasm of sigmoid colon (HCC)    Numbness in feet    Pain in both feet    Palpitations    SOB (shortness of breath)     Past Surgical History:  Procedure Laterality Date   BOWEL RESECTION     COLON SURGERY     ECTOPIC PREGNANCY SURGERY  1980   INCISION AND DRAINAGE ABSCESS      Current Medications: Current Meds  Medication Sig   Ascorbic Acid (VITAMIN C) 1000 MG tablet Take 1,000 mg by mouth every morning.   Biotin 10000 MCG TABS Take 10,000 mcg by mouth every morning.   Carboxymethylcellulose Sod PF (REFRESH PLUS) 0.5 % SOLN Place 1 drop into both eyes daily as needed (dry eyes).   esomeprazole (NEXIUM) 20 MG capsule Take 20 mg by mouth every morning.   gabapentin (NEURONTIN) 300 MG capsule Take 300 mg by mouth at bedtime.   ibuprofen (ADVIL) 200 MG tablet Take 200-400 mg by mouth daily as needed for headache.   metoprolol tartrate (LOPRESSOR) 50 MG tablet Take 1 tablet (50 mg total) by mouth once for 1 dose. Take 90-120 minutes  prior to scan.   polyethylene glycol (MIRALAX / GLYCOLAX) 17 g packet Take 17 g by mouth daily.   potassium chloride SA (KLOR-CON M) 20 MEQ tablet Take 20 mEq by mouth 2 (two) times daily.   senna (SENOKOT) 8.6 MG TABS tablet Take 1 tablet by mouth.   zinc gluconate 50 MG tablet Take 50 mg by mouth daily.     Allergies:   Zosyn [piperacillin sod-tazobactam so] and Sulfa antibiotics   Social History   Socioeconomic History   Marital status: Married    Spouse name: Not on file   Number of children: Not on file   Years of education: Not on file   Highest education level: Not on file  Occupational History    Not on file  Tobacco Use   Smoking status: Never   Smokeless tobacco: Never  Substance and Sexual Activity   Alcohol use: Never   Drug use: Never   Sexual activity: Not on file  Other Topics Concern   Not on file  Social History Narrative   Not on file   Social Determinants of Health   Financial Resource Strain: Not on file  Food Insecurity: Not on file  Transportation Needs: Not on file  Physical Activity: Not on file  Stress: Not on file  Social Connections: Not on file     Family History: The patient's family history includes Cancer in her brother and father; Diabetes in her mother; Heart disease in her father and mother.  ROS:   Review of Systems  Constitutional:  Positive for malaise/fatigue. Negative for chills and fever.  HENT:  Negative for nosebleeds and tinnitus.   Eyes:  Negative for blurred vision and pain.  Respiratory:  Negative for cough and sputum production.   Cardiovascular:  Positive for chest pain and palpitations. Negative for orthopnea, claudication, leg swelling and PND.  Gastrointestinal:  Negative for nausea and vomiting.  Genitourinary:  Negative for dysuria.  Musculoskeletal:  Negative for falls.  Neurological:  Positive for dizziness. Negative for tremors.  Psychiatric/Behavioral:  Negative for depression, substance abuse and suicidal ideas. The patient has insomnia.      EKGs/Labs/Other Studies Reviewed:    The following studies were reviewed today:  Chest CT 02/09/2022 ([place]): IMPRESSION 1. Complete resolution of the prior abscess in the subcutaneous  tissues along the ostomy site.  2. Stable peristomal hernia containing a loop of small bowel without  complicating feature.  3. Suspected endometrial thickening at 1.8 cm. This is abnormal in  the postmenopausal setting and raises the possibility of endometrial  carcinoma. Pelvic sonography recommended for confirmation and to  further assess.  4. Other imaging findings of  potential clinical significance: Aortic  Atherosclerosis (ICD10-I70.0). Mild cardiomegaly. Hepatic hypodense  lesions favoring benign cysts. Anterior fundal fibroid. Left  Bartholin cyst. Small supraumbilical hernia contains adipose tissue.  EKG:  EKG is personally reviewed. 06/21/2022: normal sinus rhythm. Rate 60 bpm. 1st degree AV block.  Recent Labs: No results found for requested labs within last 365 days.  Recent Lipid Panel No results found for: "CHOL", "TRIG", "HDL", "CHOLHDL", "VLDL", "LDLCALC", "LDLDIRECT"   Risk Assessment/Calculations:                Physical Exam:    VS:  BP 128/72   Pulse 64   Ht 5' 5.5" (1.664 m)   Wt 137 lb (62.1 kg)   SpO2 97%   BMI 22.45 kg/m     Wt Readings from Last 3 Encounters:  06/21/22 137 lb (62.1 kg)  05/03/21 138 lb (62.6 kg)     GEN: Well nourished, well developed in no acute distress HEENT: Normal NECK: No JVD; No carotid bruits CARDIAC: RRR, no murmurs, rubs, gallops RESPIRATORY:  Clear to auscultation without rales, wheezing or rhonchi  ABDOMEN: Soft, non-tender, non-distended MUSCULOSKELETAL:  No edema; No deformity  SKIN: Warm and dry NEUROLOGIC:  Alert and oriented x 3 PSYCHIATRIC:  Normal affect   ASSESSMENT:    1. Precordial pain   2. Palpitations   3. Medication management   4. Encounter for laboratory test    PLAN:    In order of problems listed above:  #Chest Pain: Patient reports episodes of chest heaviness radiating to her jar and down her arm. This initially occurred following treatment with 5-FU for her adenocarcinoma. These treatments were stopped in August and she continues to have intermittent episodes of chest heaviness. These are not associated with exertion and usually abate after 5-3mn. Given symptoms, family history of CAD (mother, brother ((13s and father), will check coronary CTA and echo for further evaluation. -Check coronary CTA -Check TTE with strain   #Palpitations: Patient is  having episodes of intermittent palpitations at home that occur about every other day occurring for 20-342m at a time. No known triggers but she admits that she drinks 3 sundrops per day which may be contributing. Kardia device strip brought today shows frequent PACs and PVCs but no Afib. Will interrogate further with 7 day zio for further evaluation. -Check zio  -Check TTE as above   #Aortic Atherosclerosis: Patient would like to await her cholesterol results prior to starting statin therapy.  -Check lipid panel and coronary CTA as above -Will cater statin dosing to LDL   #Adenocarcinoma of the Sigmoid Colon: Diagnosed in 08/2021 when she presented with bowel perforation. Currently stage IIIB. On 5-FU and oxaliplatin. [facility] -Follow-up with Heme as scheduled        Follow up in 6 months.  Medication Adjustments/Labs and Tests Ordered: Current medicines are reviewed at length with the patient today.  Concerns regarding medicines are outlined above.  Orders Placed This Encounter  Procedures   CT CORONARY MORPH W/CTA COR W/SCORE W/CA W/CM &/OR WO/CM   Basic metabolic panel   Lipid Profile   LONG TERM MONITOR (3-14 DAYS)   EKG 12-Lead   ECHOCARDIOGRAM COMPLETE   Meds ordered this encounter  Medications   metoprolol tartrate (LOPRESSOR) 50 MG tablet    Sig: Take 1 tablet (50 mg total) by mouth once for 1 dose. Take 90-120 minutes prior to scan.    Dispense:  1 tablet    Refill:  0    Patient Instructions  Medication Instructions:   Your physician recommends that you continue on your current medications as directed. Please refer to the Current Medication list given to you today.  *If you need a refill on your cardiac medications before your next appointment, please call your pharmacy*   Lab Work:  TODAY--BMET AND LIPIDS  If you have labs (blood work) drawn today and your tests are completely normal, you will receive your results only by: [place] you have MyChart) OR A paper copy in the mail If you have any lab test that is abnormal or we need to change your treatment, we will call you to review the results.   Testing/Procedures:  Your physician has requested that you have an echocardiogram. Echocardiography is a painless test that uses sound waves  to create images of your heart. It provides your doctor with information about the size and shape of your heart and how well your heart's chambers and valves are working. This procedure takes approximately one hour. There are no restrictions for this procedure.   ZIO XT- Long Term Monitor Instructions  Your physician has requested you wear a ZIO patch monitor for 7 days.  This is a single patch monitor. [employer] supplies one patch monitor per enrollment. Additional stickers are not available. Please do not apply patch if you will be having a Nuclear Stress Test,  Echocardiogram, Cardiac CT, MRI, or Chest Xray during the period you would be wearing the  monitor. The patch cannot be worn during these tests. You cannot remove and re-apply the  ZIO XT patch monitor.  Your ZIO patch monitor will be mailed 3 day USPS to your address on file. It may take 3-5 days  to receive your monitor after you have been enrolled.  Once you have received your monitor, please review the enclosed instructions. Your monitor  has already been registered assigning a specific monitor serial # to you.  Billing and Patient Assistance Program Information  We have supplied [employer] with any of your insurance information on file for billing purposes. [employer] offers a sliding scale Patient Assistance Program for patients that do not have  insurance, or whose insurance does not completely cover the cost of the ZIO monitor.  You must apply for the Patient Assistance Program to qualify for this discounted rate.  To apply, please call [employer] at [phone], select option 4, select option 2, ask to apply for   Patient Assistance Program. Theodore Demark will ask your household income, and how many people  are in your household. They will quote your out-of-pocket cost based on that information.  [employer] will also be able to set up a 43-month interest-free payment plan if needed.  Applying the monitor   Shave hair from upper left chest.  Hold abrader disc by orange tab. Rub abrader in 40 strokes over the upper left chest as  indicated in your monitor instructions.  Clean area with 4 enclosed alcohol pads. Let dry.  Apply patch as indicated in monitor instructions. Patch will be placed under collarbone on left  side of chest with arrow pointing upward.  Rub patch adhesive wings for 2 minutes. Remove white label marked "1". Remove the white  label marked "2". Rub patch adhesive wings for 2 additional minutes.  While looking in a mirror, press and release button in center of patch. A small green light will  flash 3-4 times. This will be your only indicator that the monitor has been turned on.  Do not shower for the first 24 hours. You may shower after the first 24 hours.  Press the button if you feel a symptom. You will hear a small click. Record Date, Time and  Symptom in the Patient Logbook.  When you are ready to remove the patch, follow instructions on the last 2 pages of Patient  Logbook. Stick patch monitor onto the last page of Patient Logbook.  Place Patient Logbook in the blue and white box. Use locking tab on box and tape box closed  securely. The blue and white box has prepaid postage on it. Please place it in the mailbox as  soon as possible. Your physician should have your test results approximately 7 days after the  [facility]  Call [place]at [phone]  if you have questions regarding  your ZIO XT patch monitor. Call them immediately if you see an orange light blinking on your  monitor.  If your monitor falls off in less than 4  days, contact our Monitor department at [phone].  If your monitor becomes loose or falls off after 4 days call [employer] at [phone] for  suggestions on securing your monitor     Your cardiac CT will be scheduled at one of the below locations:   [facility] [address] [place], [place] [address] [phone]   If scheduled at [facility], please arrive at the [facility] and [facility] Entrance (Entrance C2) of [facility] 30 minutes prior to test start time. You can use the FREE valet parking offered at entrance C (encouraged to control the heart rate for the test)  Proceed to the [facility] Radiology Department (first floor) to check-in and test prep.  All radiology patients and guests should use entrance C2 at [facility], accessed from [facility], even though the hospital's physical address listed is [address]      Please follow these instructions carefully (unless otherwise directed):   On the Night Before the Test: Be sure to Drink plenty of water. Do not consume any caffeinated/decaffeinated beverages or chocolate 12 hours prior to your test. Do not take any antihistamines 12 hours prior to your test.   On the Day of the Test: Drink plenty of water until 1 hour prior to the test. Do not eat any food 1 hour prior to test. You may take your regular medications prior to the test.  Take metoprolol 50 MG BY MOUTH (Lopressor) two hours prior to test.  FEMALES- please wear underwire-free bra if available, avoid dresses & tight clothing       After the Test: Drink plenty of water. After receiving IV contrast, you may experience a mild flushed feeling. This is normal. On occasion, you may experience a mild rash up to 24 hours after the test. This is not dangerous. If this occurs, you can take Benadryl 25 mg and increase your fluid intake. If you experience trouble breathing, this can be serious. If  it is severe call 911 IMMEDIATELY. If it is mild, please call our office.  We will call to schedule your test 2-4 weeks out understanding that some insurance companies will need an authorization prior to the service being performed.   For non-scheduling related questions, please contact the cardiac imaging nurse navigator should you have any questions/concerns: Marchia Bond, Cardiac Imaging Nurse Navigator Gordy Clement, Cardiac Imaging Nurse Navigator [facility] Direct Office Dial: [phone]   For scheduling needs, including cancellations and rescheduling, please call [place], [phone].    Follow-Up: At [facility], you and your health needs are our priority.  As part of our continuing mission to provide you with exceptional heart care, we have created designated Provider Care Teams.  These Care Teams include your primary Cardiologist (physician) and Advanced Practice Providers (APPs -  Physician Assistants and Nurse Practitioners) who all work together to provide you with the care you need, when you need it.  We recommend signing up for the patient portal called "MyChart".  Sign up information is provided on this After Visit Summary.  MyChart is used to connect with patients for Virtual Visits (Telemedicine).  Patients are able to view lab/test results, encounter notes, upcoming appointments, etc.  Non-urgent messages can be sent  to your provider as well.   To learn more about what you can do with MyChart, go to [email or web address].    Your next appointment:   6 month(s)  The format for your next appointment:   In Person  Provider:   Robbie Lis, PA-C, Nicholes Rough, PA-C, Melina Copa, PA-C, Ambrose Pancoast, NP, Cecilie Kicks, NP, Ermalinda Barrios, PA-C, Christen Bame, NP, or Richardson Dopp, PA-C          Important Information About Sugar         I,Rachel Rivera,acting as a scribe for Freada Bergeron, MD.,have documented all relevant  documentation on the behalf of Freada Bergeron, MD,as directed by  Freada Bergeron, MD while in the presence of Freada Bergeron, MD.   I, Freada Bergeron, MD, have reviewed all documentation for this visit. The documentation on 06/21/22 for the exam, diagnosis, procedures, and orders are all accurate and complete.   Kirt Boys  06/21/2022 9:24 AM    [place]

## 2022-06-21 NOTE — Progress Notes (Unsigned)
Enrolled for [employer] to mail a ZIO XT long term holter monitor to the patients address on file.  

## 2022-06-21 NOTE — Patient Instructions (Signed)
Medication Instructions:   Your physician recommends that you continue on your current medications as directed. Please refer to the Current Medication list given to you today.  *If you need a refill on your cardiac medications before your next appointment, please call your pharmacy*   Lab Work:  TODAY--BMET AND LIPIDS  If you have labs (blood work) drawn today and your tests are completely normal, you will receive your results only by: [facility] (if you have MyChart) OR A paper copy in the mail If you have any lab test that is abnormal or we need to change your treatment, we will call you to review the results.   Testing/Procedures:  Your physician has requested that you have an echocardiogram. Echocardiography is a painless test that uses sound waves to create images of your heart. It provides your doctor with information about the size and shape of your heart and how well your heart's chambers and valves are working. This procedure takes approximately one hour. There are no restrictions for this procedure.   ZIO XT- Long Term Monitor Instructions  Your physician has requested you wear a ZIO patch monitor for 7 days.  This is a single patch monitor. [employer] supplies one patch monitor per enrollment. Additional stickers are not available. Please do not apply patch if you will be having a Nuclear Stress Test,  Echocardiogram, Cardiac CT, MRI, or Chest Xray during the period you would be wearing the  monitor. The patch cannot be worn during these tests. You cannot remove and re-apply the  ZIO XT patch monitor.  Your ZIO patch monitor will be mailed 3 day USPS to your address on file. It may take 3-5 days  to receive your monitor after you have been enrolled.  Once you have received your monitor, please review the enclosed instructions. Your monitor  has already been registered assigning a specific monitor serial # to you.  Billing and Patient Assistance Program  Information  We have supplied [employer] with any of your insurance information on file for billing purposes. [employer] offers a sliding scale Patient Assistance Program for patients that do not have  insurance, or whose insurance does not completely cover the cost of the ZIO monitor.  You must apply for the Patient Assistance Program to qualify for this discounted rate.  To apply, please call [employer] at [phone], select option 4, select option 2, ask to apply for  Patient Assistance Program. Theodore Demark will ask your household income, and how many people  are in your household. They will quote your out-of-pocket cost based on that information.  [employer] will also be able to set up a 58-month interest-free payment plan if needed.  Applying the monitor   Shave hair from upper left chest.  Hold abrader disc by orange tab. Rub abrader in 40 strokes over the upper left chest as  indicated in your monitor instructions.  Clean area with 4 enclosed alcohol pads. Let dry.  Apply patch as indicated in monitor instructions. Patch will be placed under collarbone on left  side of chest with arrow pointing upward.  Rub patch adhesive wings for 2 minutes. Remove white label marked "1". Remove the white  label marked "2". Rub patch adhesive wings for 2 additional minutes.  While looking in a mirror, press and release button in center of patch. A small green light will  flash 3-4 times. This will be your only indicator that the monitor has been turned on.  Do not shower for the first 24  hours. You may shower after the first 24 hours.  Press the button if you feel a symptom. You will hear a small click. Record Date, Time and  Symptom in the Patient Logbook.  When you are ready to remove the patch, follow instructions on the last 2 pages of Patient  Logbook. Stick patch monitor onto the last page of Patient Logbook.  Place Patient Logbook in the blue and white box. Use locking tab on box and tape box closed   securely. The blue and white box has prepaid postage on it. Please place it in the mailbox as  soon as possible. Your physician should have your test results approximately 7 days after the  monitor has been mailed back to [facility].  Call [facility] at [phone] if you have questions regarding  your ZIO XT patch monitor. Call them immediately if you see an orange light blinking on your  monitor.  If your monitor falls off in less than 4 days, contact our Monitor department at [phone].  If your monitor becomes loose or falls off after 4 days call [employer] at [phone] for  suggestions on securing your monitor     Your cardiac CT will be scheduled at one of the below locations:   [facility] [address] [place], [place] [address] [phone]   If scheduled at [facility], please arrive at the [facility] Entrance (Entrance C2) of [facility] 30 minutes prior to test start time. You can use the FREE valet parking offered at entrance C (encouraged to control the heart rate for the test)  Proceed to the [facility] Radiology Department (first floor) to check-in and test prep.  All radiology patients and guests should use entrance C2 at [facility], accessed from [facility], even though the hospital's physical address listed is [address]      Please follow these instructions carefully (unless otherwise directed):   On the Night Before the Test: Be sure to Drink plenty of water. Do not consume any caffeinated/decaffeinated beverages or chocolate 12 hours prior to your test. Do not take any antihistamines 12 hours prior to your test.   On the Day of the Test: Drink plenty of water until 1 hour prior to the test. Do not eat any food 1 hour prior to test. You may take your regular medications prior to the test.  Take metoprolol 50 MG BY MOUTH (Lopressor) two hours  prior to test.  FEMALES- please wear underwire-free bra if available, avoid dresses & tight clothing       After the Test: Drink plenty of water. After receiving IV contrast, you may experience a mild flushed feeling. This is normal. On occasion, you may experience a mild rash up to 24 hours after the test. This is not dangerous. If this occurs, you can take Benadryl 25 mg and increase your fluid intake. If you experience trouble breathing, this can be serious. If it is severe call 911 IMMEDIATELY. If it is mild, please call our office.  We will call to schedule your test 2-4 weeks out understanding that some insurance companies will need an authorization prior to the service being performed.   For non-scheduling related questions, please contact the cardiac imaging nurse navigator should you have any questions/concerns: Marchia Bond, Cardiac Imaging Nurse Navigator Gordy Clement, Cardiac Imaging Nurse Navigator  Heart and Vascular Services Direct Office Dial: [phone]   For scheduling needs, including  cancellations and rescheduling, please call [place], [phone].    Follow-Up: At [facility], you and your health needs are our priority.  As part of our continuing mission to provide you with exceptional heart care, we have created designated Provider Care Teams.  These Care Teams include your primary Cardiologist (physician) and Advanced Practice Providers (APPs -  Physician Assistants and Nurse Practitioners) who all work together to provide you with the care you need, when you need it.  We recommend signing up for the patient portal called "MyChart".  Sign up information is provided on this After Visit Summary.  MyChart is used to connect with patients for Virtual Visits (Telemedicine).  Patients are able to view lab/test results, encounter notes, upcoming appointments, etc.  Non-urgent messages can be sent to your provider as well.   To learn more about what  you can do with MyChart, go to [email or web address].    Your next appointment:   6 month(s)  The format for your next appointment:   In Person  Provider:   Robbie Lis, PA-C, Nicholes Rough, PA-C, Melina Copa, PA-C, Ambrose Pancoast, NP, Cecilie Kicks, NP, Ermalinda Barrios, PA-C, Christen Bame, NP, or Richardson Dopp, PA-C          Important Information About Sugar

## 2022-06-22 ENCOUNTER — Telehealth: Payer: Self-pay | Admitting: *Deleted

## 2022-06-22 DIAGNOSIS — E785 Hyperlipidemia, unspecified: Secondary | ICD-10-CM

## 2022-06-22 DIAGNOSIS — Z79899 Other long term (current) drug therapy: Secondary | ICD-10-CM

## 2022-06-22 LAB — BASIC METABOLIC PANEL
BUN/Creatinine Ratio: 18 (ref 12–28)
BUN: 13 mg/dL (ref 8–27)
CO2: 24 mmol/L (ref 20–29)
Calcium: 10.1 mg/dL (ref 8.7–10.3)
Chloride: 102 mmol/L (ref 96–106)
Creatinine, Ser: 0.72 mg/dL (ref 0.57–1.00)
Glucose: 93 mg/dL (ref 70–99)
Potassium: 4.4 mmol/L (ref 3.5–5.2)
Sodium: 143 mmol/L (ref 134–144)
eGFR: 92 mL/min/{1.73_m2} (ref >59)

## 2022-06-22 LAB — LIPID PANEL
Chol/HDL Ratio: 4.8 ratio — ABNORMAL HIGH (ref 0.0–4.4)
Cholesterol, Total: 216 mg/dL — ABNORMAL HIGH (ref 100–199)
HDL: 45 mg/dL (ref >39)
LDL Chol Calc (NIH): 150 mg/dL — ABNORMAL HIGH (ref 0–99)
Triglycerides: 119 mg/dL (ref 0–149)
VLDL Cholesterol Cal: 21 mg/dL (ref 5–40)

## 2022-06-22 MED ORDER — ROSUVASTATIN CALCIUM 20 MG PO TABS: 20.0000 mg | ORAL_TABLET | Freq: Every day | ORAL | 2 refills | Status: DC

## 2022-06-22 NOTE — Telephone Encounter (Signed)
-----   Message from Freada Bergeron, MD sent at 06/22/2022  8:20 AM EDT ----- Her LDL is elevated at 150. Goal <70. Can we start her on crestor '20mg'$  daily to try to get her to goal. Will repeat lipids in 6-8 weeks for monitoring.

## 2022-06-22 NOTE — Telephone Encounter (Signed)
The patient has been notified of the result and verbalized understanding.  All questions (if any) were answered.  Pt aware to start taking crestor 20 mg po daily and come in for repeat lipids in 8 weeks.   Confirmed the pharmacy of choice with the pt.    Pt will come in for repeat lipids in 8 weeks on 08/17/22.  She is aware to come fasting to this lab appt.  Pt verbalized understanding and agrees with this plan.

## 2022-06-28 DIAGNOSIS — Z882 Allergy status to sulfonamides status: Secondary | ICD-10-CM | POA: Diagnosis not present

## 2022-06-28 DIAGNOSIS — K5720 Diverticulitis of large intestine with perforation and abscess without bleeding: Secondary | ICD-10-CM | POA: Diagnosis not present

## 2022-06-28 DIAGNOSIS — Z808 Family history of malignant neoplasm of other organs or systems: Secondary | ICD-10-CM | POA: Diagnosis not present

## 2022-06-28 DIAGNOSIS — K631 Perforation of intestine (nontraumatic): Secondary | ICD-10-CM | POA: Diagnosis not present

## 2022-06-28 DIAGNOSIS — Z8042 Family history of malignant neoplasm of prostate: Secondary | ICD-10-CM | POA: Diagnosis not present

## 2022-06-28 DIAGNOSIS — Z803 Family history of malignant neoplasm of breast: Secondary | ICD-10-CM | POA: Diagnosis not present

## 2022-06-28 DIAGNOSIS — Z9221 Personal history of antineoplastic chemotherapy: Secondary | ICD-10-CM | POA: Diagnosis not present

## 2022-06-28 DIAGNOSIS — Z5111 Encounter for antineoplastic chemotherapy: Secondary | ICD-10-CM | POA: Diagnosis not present

## 2022-06-28 DIAGNOSIS — C187 Malignant neoplasm of sigmoid colon: Secondary | ICD-10-CM | POA: Diagnosis not present

## 2022-06-29 ENCOUNTER — Ambulatory Visit (HOSPITAL_COMMUNITY): Payer: Medicare HMO | Attending: Internal Medicine

## 2022-06-29 ENCOUNTER — Telehealth: Payer: Self-pay | Admitting: *Deleted

## 2022-06-29 DIAGNOSIS — R002 Palpitations: Secondary | ICD-10-CM | POA: Diagnosis not present

## 2022-06-29 DIAGNOSIS — R072 Precordial pain: Secondary | ICD-10-CM

## 2022-06-29 DIAGNOSIS — I77819 Aortic ectasia, unspecified site: Secondary | ICD-10-CM

## 2022-06-29 LAB — ECHOCARDIOGRAM COMPLETE
Area-P 1/2: 3.72 cm2
S' Lateral: 2.3 cm

## 2022-06-29 NOTE — Telephone Encounter (Signed)
The patient has been notified of the result and verbalized understanding.  All questions (if any) were answered.  Pt aware that she will need another echo in one year for surveillance of dilated aorta.  Pt aware I will go ahead and place the order in the system, and have our Echo Scheduler call her back to arrange this appt.   Pt verbalized understanding and agrees with this plan.

## 2022-06-29 NOTE — Telephone Encounter (Signed)
-----   Message from Freada Bergeron, MD sent at 06/29/2022  2:52 PM EDT ----- Her echo shows low normal pumping function. Her heart has abnormal relaxation which means it is a little stiff. To help prevent this from progressing, we need to ensure her blood pressure is well controlled. Her valves look good. Her aorta is very slightly dilated which we will monitor going forward with yearly echoes.

## 2022-07-06 ENCOUNTER — Telehealth (HOSPITAL_COMMUNITY): Payer: Self-pay | Admitting: *Deleted

## 2022-07-06 NOTE — Telephone Encounter (Signed)
Reaching out to patient to offer assistance regarding upcoming cardiac imaging study; pt verbalizes understanding of appt date/time, parking situation and where to check in, medications ordered, and verified current allergies; name and call back number provided for further questions should they arise  Gordy Clement RN Navigator Cardiac [place] and Vascular [phone] office [phone] cell  Patient to take '25mg'$  metoprolol tartrate two hours prior to her cardiac scan. She is aware to arrive at 8am.

## 2022-07-06 NOTE — Telephone Encounter (Signed)
Attempted to call patient regarding upcoming cardiac CT appointment. °Left message on voicemail with name and callback number ° °Kimiah Hibner RN Navigator Cardiac Imaging °[facility] °[phone] Office °[phone] Cell ° °

## 2022-07-07 ENCOUNTER — Encounter (HOSPITAL_COMMUNITY): Payer: Self-pay | Admitting: Cardiology

## 2022-07-07 ENCOUNTER — Ambulatory Visit (HOSPITAL_COMMUNITY)
Admission: RE | Admit: 2022-07-07 | Discharge: 2022-07-07 | Disposition: A | Discharge status: Home / Self Care | Payer: Medicare HMO | Source: Ambulatory Visit | Attending: Cardiology | Admitting: Cardiology

## 2022-07-07 DIAGNOSIS — R072 Precordial pain: Secondary | ICD-10-CM | POA: Diagnosis not present

## 2022-07-07 MED ORDER — NITROGLYCERIN 0.4 MG SL SUBL
SUBLINGUAL_TABLET | SUBLINGUAL | Status: AC
  Administered 2022-07-07: 0.8 mg via SUBLINGUAL
  Filled 2022-07-07: qty 2

## 2022-07-07 MED ORDER — IOHEXOL 350 MG/ML SOLN
95.0000 mL | Freq: Once | INTRAVENOUS | Status: AC | PRN
  Administered 2022-07-07: 95 mL via INTRAVENOUS

## 2022-07-07 MED ORDER — NITROGLYCERIN 0.4 MG SL SUBL: 0.8000 mg | SUBLINGUAL_TABLET | Freq: Once | SUBLINGUAL | Status: AC

## 2022-07-11 DIAGNOSIS — R9389 Abnormal findings on diagnostic imaging of other specified body structures: Secondary | ICD-10-CM | POA: Diagnosis not present

## 2022-07-11 DIAGNOSIS — C187 Malignant neoplasm of sigmoid colon: Secondary | ICD-10-CM | POA: Diagnosis not present

## 2022-07-11 DIAGNOSIS — C189 Malignant neoplasm of colon, unspecified: Secondary | ICD-10-CM | POA: Diagnosis not present

## 2022-07-15 DIAGNOSIS — R072 Precordial pain: Secondary | ICD-10-CM | POA: Diagnosis not present

## 2022-07-18 ENCOUNTER — Telehealth: Payer: Self-pay | Admitting: Cardiology

## 2022-07-18 NOTE — Telephone Encounter (Signed)
Pt is calling for heart monitor results.   Monitor results are in Dr. Jacolyn Reedy in-basket to review.  Will send this message to Dr. Johney Frame to further review and advise on results.  Will follow-up with the pt accordingly thereafter.

## 2022-07-18 NOTE — Telephone Encounter (Signed)
Patient is calling for results to her monitor. 

## 2022-07-25 ENCOUNTER — Telehealth: Payer: Self-pay | Admitting: Cardiology

## 2022-07-25 MED ORDER — ROSUVASTATIN CALCIUM 20 MG PO TABS: 20.0000 mg | ORAL_TABLET | ORAL | 1 refills | Status: DC

## 2022-07-25 NOTE — Telephone Encounter (Signed)
-----   Message from Nuala Alpha, LPN sent at 77/11/7503  9:18 AM EST -----  ----- Message ----- From: Freada Bergeron, MD Sent: 07/22/2022   4:46 PM EST To: Cv Div Ch St Triage  Her heart monitor shows occasional extra beats from the lower chamber of the heart called premature ventricular contractions. These extra beats correlate with her symptoms. These are not harmful but can be very bothersome. Would she like to start metoprolol '25mg'$  BID to help suppress them and improve symptoms?

## 2022-07-25 NOTE — Telephone Encounter (Signed)
Patient called stating they don't have a cardiac clearance form to send to [place] to be filled out.  They said a letter can be sent to them stating she is cleared for surgery.  It can be faxed to  [phone] Dr. Rolla Etienne.

## 2022-07-25 NOTE — Telephone Encounter (Signed)
The patient has been notified of the result and verbalized understanding.  All questions (if any) were answered.  Pt will continue with her current regimen as this time and will touch base with [place] as needed, if symptoms become bothersome.   Pt did want to ask Dr. Johney Frame a question about her statin regimen.   Pt states her crestor 20 mg daily is causing her arms to ache, so she decided to try and take it every other day instead, and symptoms improved with doing this.  Pt is asking if Dr. Johney Frame thinks it is ok for her to take crestor 20 mg po EOD vs daily dosing.   Pt aware I will route this message back to Dr. Johney Frame to further review and advise on this matter, and I will follow-up with her accordingly thereafter.  Pt verbalized understanding and agrees with this plan.

## 2022-07-25 NOTE — Telephone Encounter (Signed)
Message Received: Today Vanessa Bergeron, MD  Nuala Alpha, LPN That is totally fine.  If her muscles are still bothering her, we can decrease to '10mg'$  daily and add zetia '10mg'$  daily  Pt aware that Dr. Johney Frame said she can take EOD dosing of her crestor 20 mg, to see if this helps with her upper extremity muscle aches.   Pt aware that per Dr. Johney Frame, if her aches are still occurring with EOD dosing, then she should let us know, so that we can decrease her crestor to 10 mg daily and add zetia 10 mg daily to her regimen, thereafter.  Pt states she will proceed in taking the crestor 20 mg EOD and will touch base with [place] as needed, if symptoms start occurring with this dosage.   Pt verbalized understanding and agrees with this plan.

## 2022-07-25 NOTE — Addendum Note (Signed)
Addended by: Nuala Alpha on: 07/25/2022 10:01 AM   Modules accepted: Orders

## 2022-07-26 ENCOUNTER — Telehealth: Payer: Self-pay | Admitting: *Deleted

## 2022-07-26 DIAGNOSIS — E069 Thyroiditis, unspecified: Secondary | ICD-10-CM | POA: Diagnosis not present

## 2022-07-26 DIAGNOSIS — C187 Malignant neoplasm of sigmoid colon: Secondary | ICD-10-CM | POA: Diagnosis not present

## 2022-07-26 DIAGNOSIS — Z5111 Encounter for antineoplastic chemotherapy: Secondary | ICD-10-CM | POA: Diagnosis not present

## 2022-07-26 DIAGNOSIS — K631 Perforation of intestine (nontraumatic): Secondary | ICD-10-CM | POA: Diagnosis not present

## 2022-07-26 DIAGNOSIS — K5720 Diverticulitis of large intestine with perforation and abscess without bleeding: Secondary | ICD-10-CM | POA: Diagnosis not present

## 2022-07-26 DIAGNOSIS — I427 Cardiomyopathy due to drug and external agent: Secondary | ICD-10-CM | POA: Diagnosis not present

## 2022-07-26 NOTE — Telephone Encounter (Signed)
     Primary Cardiologist: Freada Bergeron, MD  Chart reviewed as part of pre-operative protocol coverage. Given past medical history and time since last visit, based on ACC/AHA guidelines, Vanessa Mcclure would be at acceptable risk for the planned procedure without further cardiovascular testing.   Her RCRI is a class II risk, 0.9% risk of major cardiac event.  I will route this recommendation to the requesting party via Epic fax function and remove from pre-op pool.  Please call with questions.  Jossie Ng. [name] NP-C     07/26/2022, 10:22 AM [place] [address] Office [phone] Fax [phone]

## 2022-07-26 NOTE — Telephone Encounter (Signed)
   Pre-operative Risk Assessment    Patient Name: Vanessa Mcclure  DOB: 26-Jan-1956 MRN: [id number]      Request for Surgical Clearance    Procedure:   COLONOSCOPY, FLEXIBLE, PROXIMAL TO SPLENIC FLEXURE; DIAGNOSTIC, W/WO COLLECTION SPECIMEN BY BR[facility] OR [facility]  Date of Surgery:  Clearance 09/07/22                                 Surgeon:  DR. Bradly Chris Surgeon's Group or Practice Name:  [facility] Phone number:  [phone] Fax number:  [phone] OR [phone]   Type of Clearance Requested:   - Medical ; NO MEDICATIONS LISTED AS NEEDING TO BE HELD   Type of Anesthesia:   I TRIED TO CALL THE GI OFFICE THOUGH WAS ON HOLD FOR OVER 6 MINUTES; (PROPOFOL?)   Additional requests/questions:    Jiles Prows   07/26/2022, 9:32 AM

## 2022-07-26 NOTE — Telephone Encounter (Signed)
     Primary Cardiologist: Freada Bergeron, MD  Chart reviewed as part of pre-operative protocol coverage. Given past medical history and time since last visit, based on ACC/AHA guidelines, KALEY JUTRAS would be at acceptable risk for the planned procedure without further cardiovascular testing.    I will route this recommendation to the requesting party via Epic fax function and remove from pre-op pool.  Please call with questions.  Jossie Ng. Denorris Reust NP-C     07/26/2022, 9:43 AM [place] [address] Office [phone] Fax [phone]

## 2022-07-26 NOTE — Telephone Encounter (Signed)
   Pre-operative Risk Assessment    Patient Name: Vanessa Mcclure  DOB: 1956/08/21 MRN: [id number]      Request for Surgical Clearance    Procedure:   CLOSURE OF ENTEROSTOMY, LARGE OR SMALL INTESTINE; WITH RESECTION & ANASTOMOSIS, OTHER THAN COLORECTAL  Date of Surgery:  Clearance 09/16/22                                 Surgeon:  DR. Vinetta Bergamo Surgeon's Group or Practice Name:  [place] Phone number:  [phone] Fax number:  [phone]   Type of Clearance Requested:   - Medical ;NO MEDICATIONS LISTED AS NEEDING TO BE HELD   Type of Anesthesia:  Not Indicated; I LEFT A MESSAGE FOR THE SURGERY SCHEDULER TO CALL BACK WITH TYPE OF ANESTHESIA BEING USED; (GENERAL?)   Additional requests/questions:    Jiles Prows   07/26/2022, 9:47 AM

## 2022-08-02 DIAGNOSIS — E069 Thyroiditis, unspecified: Secondary | ICD-10-CM | POA: Diagnosis not present

## 2022-08-02 DIAGNOSIS — E041 Nontoxic single thyroid nodule: Secondary | ICD-10-CM | POA: Diagnosis not present

## 2022-08-17 ENCOUNTER — Ambulatory Visit: Payer: Medicare HMO | Attending: Cardiology

## 2022-08-17 DIAGNOSIS — Z79899 Other long term (current) drug therapy: Secondary | ICD-10-CM

## 2022-08-17 DIAGNOSIS — E785 Hyperlipidemia, unspecified: Secondary | ICD-10-CM | POA: Diagnosis not present

## 2022-08-18 LAB — LIPID PANEL
Chol/HDL Ratio: 3.3 ratio (ref 0.0–4.4)
Cholesterol, Total: 147 mg/dL (ref 100–199)
HDL: 44 mg/dL (ref >39)
LDL Chol Calc (NIH): 83 mg/dL (ref 0–99)
Triglycerides: 107 mg/dL (ref 0–149)
VLDL Cholesterol Cal: 20 mg/dL (ref 5–40)

## 2022-08-23 DIAGNOSIS — C187 Malignant neoplasm of sigmoid colon: Secondary | ICD-10-CM | POA: Diagnosis not present

## 2022-08-23 DIAGNOSIS — E069 Thyroiditis, unspecified: Secondary | ICD-10-CM | POA: Diagnosis not present

## 2022-09-07 DIAGNOSIS — Z1211 Encounter for screening for malignant neoplasm of colon: Secondary | ICD-10-CM | POA: Diagnosis not present

## 2022-09-07 DIAGNOSIS — D120 Benign neoplasm of cecum: Secondary | ICD-10-CM | POA: Diagnosis not present

## 2022-09-07 DIAGNOSIS — K5730 Diverticulosis of large intestine without perforation or abscess without bleeding: Secondary | ICD-10-CM | POA: Diagnosis not present

## 2022-09-07 DIAGNOSIS — K6389 Other specified diseases of intestine: Secondary | ICD-10-CM | POA: Diagnosis not present

## 2022-09-07 DIAGNOSIS — K635 Polyp of colon: Secondary | ICD-10-CM | POA: Diagnosis not present

## 2022-09-07 DIAGNOSIS — K6289 Other specified diseases of anus and rectum: Secondary | ICD-10-CM | POA: Diagnosis not present

## 2022-09-07 DIAGNOSIS — Z85038 Personal history of other malignant neoplasm of large intestine: Secondary | ICD-10-CM | POA: Diagnosis not present

## 2022-09-07 DIAGNOSIS — C187 Malignant neoplasm of sigmoid colon: Secondary | ICD-10-CM | POA: Diagnosis not present

## 2022-09-07 DIAGNOSIS — Z79899 Other long term (current) drug therapy: Secondary | ICD-10-CM | POA: Diagnosis not present

## 2022-09-07 DIAGNOSIS — Z9049 Acquired absence of other specified parts of digestive tract: Secondary | ICD-10-CM | POA: Diagnosis not present

## 2022-09-07 DIAGNOSIS — D123 Benign neoplasm of transverse colon: Secondary | ICD-10-CM | POA: Diagnosis not present

## 2022-09-07 DIAGNOSIS — E039 Hypothyroidism, unspecified: Secondary | ICD-10-CM | POA: Diagnosis not present

## 2022-09-08 DIAGNOSIS — C187 Malignant neoplasm of sigmoid colon: Secondary | ICD-10-CM | POA: Diagnosis not present

## 2022-09-08 DIAGNOSIS — Z9049 Acquired absence of other specified parts of digestive tract: Secondary | ICD-10-CM | POA: Diagnosis not present

## 2022-09-16 DIAGNOSIS — R339 Retention of urine, unspecified: Secondary | ICD-10-CM | POA: Diagnosis not present

## 2022-09-16 DIAGNOSIS — N83201 Unspecified ovarian cyst, right side: Secondary | ICD-10-CM | POA: Diagnosis not present

## 2022-09-16 DIAGNOSIS — N83311 Acquired atrophy of right ovary: Secondary | ICD-10-CM | POA: Diagnosis not present

## 2022-09-16 DIAGNOSIS — Z85038 Personal history of other malignant neoplasm of large intestine: Secondary | ICD-10-CM | POA: Diagnosis not present

## 2022-09-16 DIAGNOSIS — Z433 Encounter for attention to colostomy: Secondary | ICD-10-CM | POA: Diagnosis not present

## 2022-09-16 DIAGNOSIS — E785 Hyperlipidemia, unspecified: Secondary | ICD-10-CM | POA: Diagnosis not present

## 2022-09-16 DIAGNOSIS — L299 Pruritus, unspecified: Secondary | ICD-10-CM | POA: Diagnosis not present

## 2022-09-16 DIAGNOSIS — Z8616 Personal history of COVID-19: Secondary | ICD-10-CM | POA: Diagnosis not present

## 2022-09-16 DIAGNOSIS — C189 Malignant neoplasm of colon, unspecified: Secondary | ICD-10-CM | POA: Diagnosis not present

## 2022-09-16 DIAGNOSIS — Z79890 Hormone replacement therapy (postmenopausal): Secondary | ICD-10-CM | POA: Diagnosis not present

## 2022-09-16 DIAGNOSIS — R109 Unspecified abdominal pain: Secondary | ICD-10-CM | POA: Diagnosis not present

## 2022-09-16 DIAGNOSIS — G8918 Other acute postprocedural pain: Secondary | ICD-10-CM | POA: Diagnosis not present

## 2022-09-16 DIAGNOSIS — Z7902 Long term (current) use of antithrombotics/antiplatelets: Secondary | ICD-10-CM | POA: Diagnosis not present

## 2022-09-17 DIAGNOSIS — G8918 Other acute postprocedural pain: Secondary | ICD-10-CM | POA: Diagnosis not present

## 2022-09-17 DIAGNOSIS — R109 Unspecified abdominal pain: Secondary | ICD-10-CM | POA: Diagnosis not present

## 2022-09-18 DIAGNOSIS — G8918 Other acute postprocedural pain: Secondary | ICD-10-CM | POA: Diagnosis not present

## 2022-09-18 DIAGNOSIS — R109 Unspecified abdominal pain: Secondary | ICD-10-CM | POA: Diagnosis not present

## 2022-09-19 DIAGNOSIS — R109 Unspecified abdominal pain: Secondary | ICD-10-CM | POA: Diagnosis not present

## 2022-09-19 DIAGNOSIS — G8918 Other acute postprocedural pain: Secondary | ICD-10-CM | POA: Diagnosis not present

## 2022-09-21 DIAGNOSIS — R109 Unspecified abdominal pain: Secondary | ICD-10-CM | POA: Diagnosis not present

## 2022-09-21 DIAGNOSIS — G8918 Other acute postprocedural pain: Secondary | ICD-10-CM | POA: Diagnosis not present

## 2022-09-27 DIAGNOSIS — C187 Malignant neoplasm of sigmoid colon: Secondary | ICD-10-CM | POA: Diagnosis not present

## 2022-09-27 DIAGNOSIS — E039 Hypothyroidism, unspecified: Secondary | ICD-10-CM | POA: Diagnosis not present

## 2022-10-06 DIAGNOSIS — Z09 Encounter for follow-up examination after completed treatment for conditions other than malignant neoplasm: Secondary | ICD-10-CM | POA: Diagnosis not present

## 2022-10-06 DIAGNOSIS — C187 Malignant neoplasm of sigmoid colon: Secondary | ICD-10-CM | POA: Diagnosis not present

## 2022-10-19 DIAGNOSIS — E782 Mixed hyperlipidemia: Secondary | ICD-10-CM | POA: Diagnosis not present

## 2022-10-19 DIAGNOSIS — K219 Gastro-esophageal reflux disease without esophagitis: Secondary | ICD-10-CM | POA: Diagnosis not present

## 2022-10-21 DIAGNOSIS — L989 Disorder of the skin and subcutaneous tissue, unspecified: Secondary | ICD-10-CM | POA: Diagnosis not present

## 2022-10-21 DIAGNOSIS — C187 Malignant neoplasm of sigmoid colon: Secondary | ICD-10-CM | POA: Diagnosis not present

## 2022-10-21 DIAGNOSIS — Z933 Colostomy status: Secondary | ICD-10-CM | POA: Diagnosis not present

## 2022-10-21 DIAGNOSIS — Z6823 Body mass index (BMI) 23.0-23.9, adult: Secondary | ICD-10-CM | POA: Diagnosis not present

## 2022-11-08 DIAGNOSIS — C187 Malignant neoplasm of sigmoid colon: Secondary | ICD-10-CM | POA: Diagnosis not present

## 2022-11-08 DIAGNOSIS — E039 Hypothyroidism, unspecified: Secondary | ICD-10-CM | POA: Diagnosis not present

## 2022-11-09 DIAGNOSIS — C44719 Basal cell carcinoma of skin of left lower limb, including hip: Secondary | ICD-10-CM | POA: Diagnosis not present

## 2022-11-09 DIAGNOSIS — L820 Inflamed seborrheic keratosis: Secondary | ICD-10-CM | POA: Diagnosis not present

## 2022-11-15 DIAGNOSIS — C187 Malignant neoplasm of sigmoid colon: Secondary | ICD-10-CM | POA: Diagnosis not present

## 2022-12-20 DIAGNOSIS — C187 Malignant neoplasm of sigmoid colon: Secondary | ICD-10-CM | POA: Diagnosis not present

## 2022-12-21 DIAGNOSIS — Z85828 Personal history of other malignant neoplasm of skin: Secondary | ICD-10-CM | POA: Diagnosis not present

## 2022-12-21 DIAGNOSIS — Z08 Encounter for follow-up examination after completed treatment for malignant neoplasm: Secondary | ICD-10-CM | POA: Diagnosis not present

## 2022-12-23 DIAGNOSIS — Z1231 Encounter for screening mammogram for malignant neoplasm of breast: Secondary | ICD-10-CM | POA: Diagnosis not present

## 2023-01-02 DIAGNOSIS — J019 Acute sinusitis, unspecified: Secondary | ICD-10-CM | POA: Diagnosis not present

## 2023-01-02 DIAGNOSIS — Z6823 Body mass index (BMI) 23.0-23.9, adult: Secondary | ICD-10-CM | POA: Diagnosis not present

## 2023-01-02 DIAGNOSIS — R030 Elevated blood-pressure reading, without diagnosis of hypertension: Secondary | ICD-10-CM | POA: Diagnosis not present

## 2023-01-02 DIAGNOSIS — E785 Hyperlipidemia, unspecified: Secondary | ICD-10-CM | POA: Diagnosis not present

## 2023-01-02 DIAGNOSIS — C187 Malignant neoplasm of sigmoid colon: Secondary | ICD-10-CM | POA: Diagnosis not present

## 2023-01-02 DIAGNOSIS — M25511 Pain in right shoulder: Secondary | ICD-10-CM | POA: Diagnosis not present

## 2023-01-02 DIAGNOSIS — Z933 Colostomy status: Secondary | ICD-10-CM | POA: Diagnosis not present

## 2023-01-02 DIAGNOSIS — M25512 Pain in left shoulder: Secondary | ICD-10-CM | POA: Diagnosis not present

## 2023-01-02 DIAGNOSIS — E039 Hypothyroidism, unspecified: Secondary | ICD-10-CM | POA: Diagnosis not present

## 2023-01-31 DIAGNOSIS — C187 Malignant neoplasm of sigmoid colon: Secondary | ICD-10-CM | POA: Diagnosis not present

## 2023-02-17 DIAGNOSIS — K219 Gastro-esophageal reflux disease without esophagitis: Secondary | ICD-10-CM | POA: Diagnosis not present

## 2023-02-17 DIAGNOSIS — E782 Mixed hyperlipidemia: Secondary | ICD-10-CM | POA: Diagnosis not present

## 2023-03-15 DIAGNOSIS — C187 Malignant neoplasm of sigmoid colon: Secondary | ICD-10-CM | POA: Diagnosis not present

## 2023-03-29 ENCOUNTER — Other Ambulatory Visit: Payer: Self-pay

## 2023-03-29 MED ORDER — ROSUVASTATIN CALCIUM 20 MG PO TABS: 20.0000 mg | ORAL_TABLET | ORAL | 1 refills | Status: DC

## 2023-04-03 DIAGNOSIS — M25511 Pain in right shoulder: Secondary | ICD-10-CM | POA: Diagnosis not present

## 2023-04-03 DIAGNOSIS — R030 Elevated blood-pressure reading, without diagnosis of hypertension: Secondary | ICD-10-CM | POA: Diagnosis not present

## 2023-04-03 DIAGNOSIS — Z933 Colostomy status: Secondary | ICD-10-CM | POA: Diagnosis not present

## 2023-04-03 DIAGNOSIS — Z6823 Body mass index (BMI) 23.0-23.9, adult: Secondary | ICD-10-CM | POA: Diagnosis not present

## 2023-04-03 DIAGNOSIS — C187 Malignant neoplasm of sigmoid colon: Secondary | ICD-10-CM | POA: Diagnosis not present

## 2023-04-03 DIAGNOSIS — E785 Hyperlipidemia, unspecified: Secondary | ICD-10-CM | POA: Diagnosis not present

## 2023-04-03 DIAGNOSIS — M25512 Pain in left shoulder: Secondary | ICD-10-CM | POA: Diagnosis not present

## 2023-04-03 DIAGNOSIS — E039 Hypothyroidism, unspecified: Secondary | ICD-10-CM | POA: Diagnosis not present

## 2023-04-06 DIAGNOSIS — C187 Malignant neoplasm of sigmoid colon: Secondary | ICD-10-CM | POA: Diagnosis not present

## 2023-04-18 DIAGNOSIS — Z780 Asymptomatic menopausal state: Secondary | ICD-10-CM | POA: Diagnosis not present

## 2023-04-18 DIAGNOSIS — M25512 Pain in left shoulder: Secondary | ICD-10-CM | POA: Diagnosis not present

## 2023-04-18 DIAGNOSIS — M25511 Pain in right shoulder: Secondary | ICD-10-CM | POA: Diagnosis not present

## 2023-04-18 DIAGNOSIS — M810 Age-related osteoporosis without current pathological fracture: Secondary | ICD-10-CM | POA: Diagnosis not present

## 2023-04-28 MED ORDER — ROSUVASTATIN CALCIUM 20 MG PO TABS: 20.0000 mg | ORAL_TABLET | ORAL | 1 refills | Status: AC

## 2023-04-28 NOTE — Addendum Note (Signed)
Addended by: Margaret Pyle D on: 04/28/2023 12:13 PM   Modules accepted: Orders

## 2023-05-02 DIAGNOSIS — C187 Malignant neoplasm of sigmoid colon: Secondary | ICD-10-CM | POA: Diagnosis not present

## 2023-05-09 DIAGNOSIS — C187 Malignant neoplasm of sigmoid colon: Secondary | ICD-10-CM | POA: Diagnosis not present

## 2023-05-19 DIAGNOSIS — E782 Mixed hyperlipidemia: Secondary | ICD-10-CM | POA: Diagnosis not present

## 2023-05-19 DIAGNOSIS — R739 Hyperglycemia, unspecified: Secondary | ICD-10-CM | POA: Diagnosis not present

## 2023-06-21 DIAGNOSIS — Z1283 Encounter for screening for malignant neoplasm of skin: Secondary | ICD-10-CM | POA: Diagnosis not present

## 2023-06-21 DIAGNOSIS — Z08 Encounter for follow-up examination after completed treatment for malignant neoplasm: Secondary | ICD-10-CM | POA: Diagnosis not present

## 2023-06-21 DIAGNOSIS — D225 Melanocytic nevi of trunk: Secondary | ICD-10-CM | POA: Diagnosis not present

## 2023-06-21 DIAGNOSIS — Z85828 Personal history of other malignant neoplasm of skin: Secondary | ICD-10-CM | POA: Diagnosis not present

## 2023-06-26 DIAGNOSIS — C187 Malignant neoplasm of sigmoid colon: Secondary | ICD-10-CM | POA: Diagnosis not present

## 2023-06-29 ENCOUNTER — Ambulatory Visit (HOSPITAL_COMMUNITY): Payer: Medicare HMO | Attending: Cardiology

## 2023-06-29 DIAGNOSIS — I340 Nonrheumatic mitral (valve) insufficiency: Secondary | ICD-10-CM | POA: Insufficient documentation

## 2023-06-29 DIAGNOSIS — I77819 Aortic ectasia, unspecified site: Secondary | ICD-10-CM | POA: Diagnosis not present

## 2023-06-29 DIAGNOSIS — C187 Malignant neoplasm of sigmoid colon: Secondary | ICD-10-CM | POA: Diagnosis not present

## 2023-06-29 DIAGNOSIS — I371 Nonrheumatic pulmonary valve insufficiency: Secondary | ICD-10-CM | POA: Diagnosis not present

## 2023-06-29 LAB — ECHOCARDIOGRAM COMPLETE
Area-P 1/2: 4.46 cm2
S' Lateral: 2.1 cm

## 2023-07-24 NOTE — Progress Notes (Unsigned)
Cardiology Office Note:  .   Date:  07/25/2023  ID:  Vanessa Mcclure, DOB 1956/07/09, MRN [id number] PCP: Vanessa Mcclure  [facility] Providers Cardiologist:  Meriam Sprague, MD (Inactive) {  History of Present Illness: .   Vanessa Mcclure is a 67 y.o. female with a past medical history of GERD, aortic atherosclerosis and adenocarcinoma of the sigmoid colon who was referred by PCP for further evaluation of chest pain and palpitations.  Patient was seen by PCP 05/19/2022.  Patient was complaining of intermittent chest pain and palpitations prompting referral to cardiology.  Was seen October 2023 in consultation by Dr. Shari Prows.  Accompanied by her husband.  Was complaining of chest pain and palpitations that began after starting chemotherapy for colon cancer.  On 2 separate visits for surgery, she was told that her heart rate was low (in the 40s).  She was not symptomatic at that time no lightheaded, dizziness, or chest pain.  Also states that after reviewing her prior EKGs (not in our system) there was concern that she may had a heart attack.  Given this, she was referred to cardiology for further management.  She complained of episodes of palpitations that lasted about 20 to 30 minutes every couple of days.  Had not noticed any aggravating relieving factors.  Symptoms resolved without intervention.  Her Kardia device was reviewed which showed PVCs and PACs.  Since stopping chemo, continue to experience chest pain described as "a little pressure deep in her chest" and lasted about 5 to 10 minutes.  Chest pain occurred randomly such as while sitting.  Denies any exertional chest pain.  No significant SOB with activity but noted she has less energy and gets more wiped out than previously.  Father was in his 67s when he had CABG and her mother was in her 20s when her CABG was performed.  Brother had stents placed and died of cancer in his 36s.  Fortunately no peripheral  edema, headaches, syncope, orthopnea, or PND.  Today, she presents with a history of cancer and chemotherapy, with a slow heart rate of 50 beats per minute, down from 64 beats per minute a year ago. She reports occasional lightheadedness, which she attributes to a nine-day cold. She also experiences palpitations, described as a fluttering sensation, every few days, typically when resting or before bedtime. These episodes last a few minutes.   The patient's energy levels have improved since stopping chemotherapy. She denies chest pain, swelling in the feet, and shortness of breath. She is on Crestor 20mg  every other day and a potassium supplement twice daily, the latter started during chemotherapy when her potassium levels dropped. She is considering reducing the potassium supplement.  The patient's recent echocardiogram showed a low normal heart pump function at 53%, a mild leak in the mitral valve, and some prolapse of the mitral valve. The heart pump function was the same a year ago, but the prolapse was not mentioned.  Reports no shortness of breath nor dyspnea on exertion. Reports no chest pain, pressure, or tightness. No edema, orthopnea, PND. Reports no palpitations.    ROS: Pertinent ROS in HPI  Studies Reviewed: Marland Kitchen        Chest CT 02/09/2022 [facility]): IMPRESSION 1. Complete resolution of the prior abscess in the subcutaneous  tissues along the ostomy site.  2. Stable peristomal hernia containing a loop of small bowel without  complicating feature.  3. Suspected endometrial thickening at 1.8 cm. This  is abnormal in  the postmenopausal setting and raises the possibility of endometrial  carcinoma. Pelvic sonography recommended for confirmation and to  further assess.  4. Other imaging findings of potential clinical significance: Aortic  Atherosclerosis (ICD10-I70.0). Mild cardiomegaly. Hepatic hypodense  lesions favoring benign cysts. Anterior fundal fibroid. Left  Bartholin  cyst. Small supraumbilical hernia contains adipose tissue.       Physical Exam:   VS:  BP (!) 140/90   Pulse (!) 50   Ht 5' 5.5" (1.664 m)   Wt 142 lb (64.4 kg)   SpO2 99%   BMI 23.27 kg/m    Wt Readings from Last 3 Encounters:  07/25/23 142 lb (64.4 kg)  06/21/22 137 lb (62.1 kg)  05/03/21 138 lb (62.6 kg)    GEN: Well nourished, well developed in no acute distress NECK: No JVD; No carotid bruits CARDIAC: RRR, no murmurs, rubs, gallops RESPIRATORY:  Clear to auscultation without rales, wheezing or rhonchi  ABDOMEN: Soft, non-tender, non-distended EXTREMITIES:  No edema; No deformity   ASSESSMENT AND PLAN: .    Chest pain -no current chest pain -continue current medication   Adenocarcinoma of the sigmoid colon -stable, done with chemo     Bradycardia Heart rate of 50 beats per minute, down from 64 beats per minute a year ago. No symptoms of fatigue, lightheadedness, or dizziness reported. No medications that could cause bradycardia. -Monitor for symptoms of fatigue, lightheadedness, or dizziness. -Consider heart rhythm monitoring if symptoms develop.  Palpitations Occurring every few days, typically at rest, lasting a few minutes. -Continue to monitor frequency and duration of episodes. - becomes more frequent can consider a montior  Mitral Valve Prolapse Mild leak noted on recent echocardiogram, with low normal heart pump function stable at 53% over the past year. No symptoms of swelling or significant shortness of breath. -No intervention needed at this time. -Monitor for symptoms of swelling and significant shortness of breath.  Hypokalemia Taking potassium supplement twice daily since chemotherapy. -Consider reducing potassium supplementation in the future, with monitoring of potassium levels.  Hyperlipidemia LDL 83 on Crestor 20mg  every other day. -Continue Crestor 20mg  every other day. -Plan to recheck cholesterol next summer.  General Health  Maintenance -Encourage physical activity, aiming for 30 minutes most days of the week.  Dispo: She can follow-up in a year with Dr. Izora Ribas   Signed, Sharlene Dory, PA-C

## 2023-07-25 ENCOUNTER — Encounter: Payer: Self-pay | Admitting: Physician Assistant

## 2023-07-25 ENCOUNTER — Ambulatory Visit: Payer: Medicare HMO | Attending: Physician Assistant | Admitting: Physician Assistant

## 2023-07-25 VITALS — BP 140/90 | HR 50 | Ht 65.5 in | Wt 142.0 lb

## 2023-07-25 DIAGNOSIS — Z1331 Encounter for screening for depression: Secondary | ICD-10-CM | POA: Diagnosis not present

## 2023-07-25 DIAGNOSIS — R002 Palpitations: Secondary | ICD-10-CM

## 2023-07-25 DIAGNOSIS — Z933 Colostomy status: Secondary | ICD-10-CM | POA: Diagnosis not present

## 2023-07-25 DIAGNOSIS — M25512 Pain in left shoulder: Secondary | ICD-10-CM | POA: Diagnosis not present

## 2023-07-25 DIAGNOSIS — R072 Precordial pain: Secondary | ICD-10-CM | POA: Diagnosis not present

## 2023-07-25 DIAGNOSIS — Z1389 Encounter for screening for other disorder: Secondary | ICD-10-CM | POA: Diagnosis not present

## 2023-07-25 DIAGNOSIS — E039 Hypothyroidism, unspecified: Secondary | ICD-10-CM | POA: Diagnosis not present

## 2023-07-25 DIAGNOSIS — E785 Hyperlipidemia, unspecified: Secondary | ICD-10-CM | POA: Diagnosis not present

## 2023-07-25 DIAGNOSIS — R030 Elevated blood-pressure reading, without diagnosis of hypertension: Secondary | ICD-10-CM | POA: Diagnosis not present

## 2023-07-25 DIAGNOSIS — Z79899 Other long term (current) drug therapy: Secondary | ICD-10-CM

## 2023-07-25 DIAGNOSIS — M25511 Pain in right shoulder: Secondary | ICD-10-CM | POA: Diagnosis not present

## 2023-07-25 DIAGNOSIS — C187 Malignant neoplasm of sigmoid colon: Secondary | ICD-10-CM | POA: Diagnosis not present

## 2023-07-25 NOTE — Patient Instructions (Signed)
Medication Instructions:   Your physician recommends that you continue on your current medications as directed. Please refer to the Current Medication list given to you today.   *If you need a refill on your cardiac medications before your next appointment, please call your pharmacy*   Lab Work:  None ordered.  If you have labs (blood work) drawn today and your tests are completely normal, you will receive your results only by: MyChart Message (if you have MyChart) OR A paper copy in the mail If you have any lab test that is abnormal or we need to change your treatment, we will call you to review the results.   Testing/Procedures:  None ordered.   Follow-Up: At [facility], you and your health needs are our priority.  As part of our continuing mission to provide you with exceptional heart care, we have created designated Provider Care Teams.  These Care Teams include your primary Cardiologist (physician) and Advanced Practice Providers (APPs -  Physician Assistants and Nurse Practitioners) who all work together to provide you with the care you need, when you need it.  We recommend signing up for the patient portal called "MyChart".  Sign up information is provided on this After Visit Summary.  MyChart is used to connect with patients for Virtual Visits (Telemedicine).  Patients are able to view lab/test results, encounter notes, upcoming appointments, etc.  Non-urgent messages can be sent to your provider as well.   To learn more about what you can do with MyChart, go to [email or web address].    Your next appointment:   1 year(s)  Provider:   Dr. Izora Ribas     Other Instructions  Your physician wants you to follow-up in: 1 year.  You will receive a reminder letter in the mail two months in advance. If you don't receive a letter, please call our office to schedule the follow-up appointment.  DASH Eating Plan DASH stands for Dietary Approaches to Stop  Hypertension. The DASH eating plan is a healthy eating plan that has been shown to: Lower high blood pressure (hypertension). Reduce your risk for type 2 diabetes, heart disease, and stroke. Help with weight loss. What are tips for following this plan? Reading food labels Check food labels for the amount of salt (sodium) per serving. Choose foods with less than 5 percent of the Daily Value (DV) of sodium. In general, foods with less than 300 milligrams (mg) of sodium per serving fit into this eating plan. To find whole grains, look for the word "whole" as the first word in the ingredient list. Shopping Buy products labeled as "low-sodium" or "no salt added." Buy fresh foods. Avoid canned foods and pre-made or frozen meals. Cooking Try not to add salt when you cook. Use salt-free seasonings or herbs instead of table salt or sea salt. Check with your health care provider or pharmacist before using salt substitutes. Do not fry foods. Cook foods in healthy ways, such as baking, boiling, grilling, roasting, or broiling. Cook using oils that are good for your heart. These include olive, canola, avocado, soybean, and sunflower oil. Meal planning  Eat a balanced diet. This should include: 4 or more servings of fruits and 4 or more servings of vegetables each day. Try to fill half of your plate with fruits and vegetables. 6-8 servings of whole grains each day. 6 or less servings of lean meat, poultry, or fish each day. 1 oz is 1 serving. A 3 oz (85 g) serving of meat is  about the same size as the palm of your hand. One egg is 1 oz (28 g). 2-3 servings of low-fat dairy each day. One serving is 1 cup (237 mL). 1 serving of nuts, seeds, or beans 5 times each week. 2-3 servings of heart-healthy fats. Healthy fats called omega-3 fatty acids are found in foods such as walnuts, flaxseeds, fortified milks, and eggs. These fats are also found in cold-water fish, such as sardines, salmon, and mackerel. Limit  how much you eat of: Canned or prepackaged foods. Food that is high in trans fat, such as fried foods. Food that is high in saturated fat, such as fatty meat. Desserts and other sweets, sugary drinks, and other foods with added sugar. Full-fat dairy products. Do not salt foods before eating. Do not eat more than 4 egg yolks a week. Try to eat at least 2 vegetarian meals a week. Eat more home-cooked food and less restaurant, buffet, and fast food. Lifestyle When eating at a restaurant, ask if your food can be made with less salt or no salt. If you drink alcohol: Limit how much you have to: 0-1 drink a day if you are female. 0-2 drinks a day if you are female. Know how much alcohol is in your drink. In the [place] one drink is one 12 oz bottle of beer (355 mL), one 5 oz glass of wine (148 mL), or one 1 oz glass of hard liquor (44 mL). General information Avoid eating more than 2,300 mg of salt a day. If you have hypertension, you may need to reduce your sodium intake to 1,500 mg a day. Work with your provider to stay at a healthy body weight or lose weight. Ask what the best weight range is for you. On most days of the week, get at least 30 minutes of exercise that causes your heart to beat faster. This may include walking, swimming, or biking. Work with your provider or dietitian to adjust your eating plan to meet your specific calorie needs. What foods should I eat? Fruits All fresh, dried, or frozen fruit. Canned fruits that are in their natural juice and do not have sugar added to them. Vegetables Fresh or frozen vegetables that are raw, steamed, roasted, or grilled. Low-sodium or reduced-sodium tomato and vegetable juice. Low-sodium or reduced-sodium tomato sauce and tomato paste. Low-sodium or reduced-sodium canned vegetables. Grains Whole-grain or whole-wheat bread. Whole-grain or whole-wheat pasta. Brown rice. Orpah Cobb. Bulgur. Whole-grain and low-sodium cereals. Pita bread.  Low-fat, low-sodium crackers. Whole-wheat flour tortillas. Meats and other proteins Skinless chicken or [place]. Ground chicken or [place]. Pork with fat trimmed off. Fish and seafood. Egg whites. Dried beans, peas, or lentils. Unsalted nuts, nut butters, and seeds. Unsalted canned beans. Lean cuts of beef with fat trimmed off. Low-sodium, lean precooked or cured meat, such as sausages or meat loaves. Dairy Low-fat (1%) or fat-free (skim) milk. Reduced-fat, low-fat, or fat-free cheeses. Nonfat, low-sodium ricotta or cottage cheese. Low-fat or nonfat yogurt. Low-fat, low-sodium cheese. Fats and oils Soft margarine without trans fats. Vegetable oil. Reduced-fat, low-fat, or light mayonnaise and salad dressings (reduced-sodium). Canola, safflower, olive, avocado, soybean, and sunflower oils. Avocado. Seasonings and condiments Herbs. Spices. Seasoning mixes without salt. Other foods Unsalted popcorn and pretzels. Fat-free sweets. The items listed above may not be all the foods and drinks you can have. Talk to a dietitian to learn more. What foods should I avoid? Fruits Canned fruit in a light or heavy syrup. Fried fruit. Fruit in cream or  butter sauce. Vegetables Creamed or fried vegetables. Vegetables in a cheese sauce. Regular canned vegetables that are not marked as low-sodium or reduced-sodium. Regular canned tomato sauce and paste that are not marked as low-sodium or reduced-sodium. Regular tomato and vegetable juices that are not marked as low-sodium or reduced-sodium. Rosita Fire. Olives. Grains Baked goods made with fat, such as croissants, muffins, or some breads. Dry pasta or rice meal packs. Meats and other proteins Fatty cuts of meat. Ribs. Fried meat. Tomasa Blase. Bologna, salami, and other precooked or cured meats, such as sausages or meat loaves, that are not lean and low in sodium. Fat from the back of a pig (fatback). Bratwurst. Salted nuts and seeds. Canned beans with added salt. Canned or  smoked fish. Whole eggs or egg yolks. Chicken or [place] with skin. Dairy Whole or 2% milk, cream, and half-and-half. Whole or full-fat cream cheese. Whole-fat or sweetened yogurt. Full-fat cheese. Nondairy creamers. Whipped toppings. Processed cheese and cheese spreads. Fats and oils Butter. Stick margarine. Lard. Shortening. Ghee. Bacon fat. Tropical oils, such as coconut, palm kernel, or palm oil. Seasonings and condiments Onion salt, garlic salt, seasoned salt, table salt, and sea salt. Worcestershire sauce. Tartar sauce. Barbecue sauce. Teriyaki sauce. Soy sauce, including reduced-sodium soy sauce. Steak sauce. Canned and packaged gravies. Fish sauce. Oyster sauce. Cocktail sauce. Store-bought horseradish. Ketchup. Mustard. Meat flavorings and tenderizers. Bouillon cubes. Hot sauces. Pre-made or packaged marinades. Pre-made or packaged taco seasonings. Relishes. Regular salad dressings. Other foods Salted popcorn and pretzels. The items listed above may not be all the foods and drinks you should avoid. Talk to a dietitian to learn more. Where to find more information National Heart, Lung, and Blood Institute (NHLBI): BuffaloDryCleaner.gl American Heart Association (AHA): heart.org Academy of Nutrition and Dietetics: eatright.org National Kidney Foundation (NKF): kidney.org This information is not intended to replace advice given to you by your health care provider. Make sure you discuss any questions you have with your health care provider. Document Revised: 09/22/2022 Document Reviewed: 09/22/2022 Elsevier Patient Education  2024 Elsevier Inc. Adopting a Healthy Lifestyle.   Weight: Know what a healthy weight is for you (roughly BMI <25) and aim to maintain this. You can calculate your body mass index on your smart phone. Unfortunately, this is not the most accurate measure of healthy weight, but it is the simplest measurement to use. A more accurate measurement involves body scanning which  measures lean muscle, fat tissue and bony density. We do not have this equipment at [facility].    Diet: Aim for 7+ servings of fruits and vegetables daily Limit animal fats in diet for cholesterol and heart health - choose grass fed whenever available Avoid highly processed foods (fast food burgers, tacos, fried chicken, pizza, hot dogs, french fries)  Saturated fat comes in the form of butter, lard, coconut oil, margarine, partially hydrogenated oils, and fat in meat. These increase your risk of cardiovascular disease.  Use healthy plant oils, such as olive, canola, soy, corn, sunflower and peanut.  Whole foods such as fruits, vegetables and whole grains have fiber  Men need > 38 grams of fiber per day Women need > 25 grams of fiber per day  Load up on vegetables and fruits - one-half of your plate: Aim for color and variety, and remember that potatoes dont count. Go for whole grains - one-quarter of your plate: Whole wheat, barley, wheat berries, quinoa, oats, brown rice, and foods made with them. If you want pasta, go with whole wheat pasta.  Protein power - one-quarter of your plate: Fish, chicken, beans, and nuts are all healthy, versatile protein sources. Limit red meat. You need carbohydrates for energy! The type of carbohydrate is more important than the amount. Choose carbohydrates such as vegetables, fruits, whole grains, beans, and nuts in the place of white rice, white pasta, potatoes (baked or fried), macaroni and cheese, cakes, cookies, and donuts.  If youre thirsty, drink water. Coffee and tea are good in moderation, but skip sugary drinks and limit milk and dairy products to one or two daily servings. Keep sugar intake at 6 teaspoons or 24 grams or LESS       Exercise: Aim for 150 min of moderate intensity exercise weekly for heart health, and weights twice weekly for bone health Stay active - any steps are better than no steps! Aim for 7-9 hours of sleep daily

## 2023-08-21 DIAGNOSIS — C187 Malignant neoplasm of sigmoid colon: Secondary | ICD-10-CM | POA: Diagnosis not present

## 2023-09-07 DIAGNOSIS — E039 Hypothyroidism, unspecified: Secondary | ICD-10-CM | POA: Diagnosis not present

## 2023-09-07 DIAGNOSIS — Z880 Allergy status to penicillin: Secondary | ICD-10-CM | POA: Diagnosis not present

## 2023-09-07 DIAGNOSIS — G4730 Sleep apnea, unspecified: Secondary | ICD-10-CM | POA: Diagnosis not present

## 2023-09-07 DIAGNOSIS — E785 Hyperlipidemia, unspecified: Secondary | ICD-10-CM | POA: Diagnosis not present

## 2023-09-07 DIAGNOSIS — K5730 Diverticulosis of large intestine without perforation or abscess without bleeding: Secondary | ICD-10-CM | POA: Diagnosis not present

## 2023-09-07 DIAGNOSIS — Z79890 Hormone replacement therapy (postmenopausal): Secondary | ICD-10-CM | POA: Diagnosis not present

## 2023-09-07 DIAGNOSIS — Z980 Intestinal bypass and anastomosis status: Secondary | ICD-10-CM | POA: Diagnosis not present

## 2023-09-07 DIAGNOSIS — Z882 Allergy status to sulfonamides status: Secondary | ICD-10-CM | POA: Diagnosis not present

## 2023-09-07 DIAGNOSIS — Z9221 Personal history of antineoplastic chemotherapy: Secondary | ICD-10-CM | POA: Diagnosis not present

## 2023-09-07 DIAGNOSIS — Z860101 Personal history of adenomatous and serrated colon polyps: Secondary | ICD-10-CM | POA: Diagnosis not present

## 2023-09-07 DIAGNOSIS — K219 Gastro-esophageal reflux disease without esophagitis: Secondary | ICD-10-CM | POA: Diagnosis not present

## 2023-09-07 DIAGNOSIS — M180 Bilateral primary osteoarthritis of first carpometacarpal joints: Secondary | ICD-10-CM | POA: Diagnosis not present

## 2023-09-07 DIAGNOSIS — Z8616 Personal history of COVID-19: Secondary | ICD-10-CM | POA: Diagnosis not present

## 2023-09-07 DIAGNOSIS — K449 Diaphragmatic hernia without obstruction or gangrene: Secondary | ICD-10-CM | POA: Diagnosis not present

## 2023-09-07 DIAGNOSIS — D124 Benign neoplasm of descending colon: Secondary | ICD-10-CM | POA: Diagnosis not present

## 2023-09-07 DIAGNOSIS — D125 Benign neoplasm of sigmoid colon: Secondary | ICD-10-CM | POA: Diagnosis not present

## 2023-09-07 DIAGNOSIS — Z85038 Personal history of other malignant neoplasm of large intestine: Secondary | ICD-10-CM | POA: Diagnosis not present

## 2023-09-07 DIAGNOSIS — I700 Atherosclerosis of aorta: Secondary | ICD-10-CM | POA: Diagnosis not present

## 2023-09-07 DIAGNOSIS — Z79899 Other long term (current) drug therapy: Secondary | ICD-10-CM | POA: Diagnosis not present

## 2023-09-07 DIAGNOSIS — Z1211 Encounter for screening for malignant neoplasm of colon: Secondary | ICD-10-CM | POA: Diagnosis not present

## 2023-09-07 DIAGNOSIS — I340 Nonrheumatic mitral (valve) insufficiency: Secondary | ICD-10-CM | POA: Diagnosis not present

## 2023-09-07 DIAGNOSIS — D122 Benign neoplasm of ascending colon: Secondary | ICD-10-CM | POA: Diagnosis not present

## 2023-09-07 DIAGNOSIS — I5032 Chronic diastolic (congestive) heart failure: Secondary | ICD-10-CM | POA: Diagnosis not present

## 2023-09-07 DIAGNOSIS — R7303 Prediabetes: Secondary | ICD-10-CM | POA: Diagnosis not present

## 2023-09-07 DIAGNOSIS — K635 Polyp of colon: Secondary | ICD-10-CM | POA: Diagnosis not present

## 2023-09-07 DIAGNOSIS — Z1212 Encounter for screening for malignant neoplasm of rectum: Secondary | ICD-10-CM | POA: Diagnosis not present

## 2023-09-07 DIAGNOSIS — D126 Benign neoplasm of colon, unspecified: Secondary | ICD-10-CM | POA: Diagnosis not present

## 2023-09-28 DIAGNOSIS — C187 Malignant neoplasm of sigmoid colon: Secondary | ICD-10-CM | POA: Diagnosis not present

## 2023-09-28 DIAGNOSIS — K219 Gastro-esophageal reflux disease without esophagitis: Secondary | ICD-10-CM | POA: Diagnosis not present

## 2023-09-28 DIAGNOSIS — Z9221 Personal history of antineoplastic chemotherapy: Secondary | ICD-10-CM | POA: Diagnosis not present

## 2023-10-03 DIAGNOSIS — R9389 Abnormal findings on diagnostic imaging of other specified body structures: Secondary | ICD-10-CM | POA: Diagnosis not present

## 2023-10-03 DIAGNOSIS — D259 Leiomyoma of uterus, unspecified: Secondary | ICD-10-CM | POA: Diagnosis not present

## 2023-10-03 DIAGNOSIS — C187 Malignant neoplasm of sigmoid colon: Secondary | ICD-10-CM | POA: Diagnosis not present

## 2023-10-20 DIAGNOSIS — N840 Polyp of corpus uteri: Secondary | ICD-10-CM | POA: Diagnosis not present

## 2023-10-20 DIAGNOSIS — R9389 Abnormal findings on diagnostic imaging of other specified body structures: Secondary | ICD-10-CM | POA: Diagnosis not present

## 2023-10-20 DIAGNOSIS — C187 Malignant neoplasm of sigmoid colon: Secondary | ICD-10-CM | POA: Diagnosis not present

## 2023-11-08 DIAGNOSIS — Z008 Encounter for other general examination: Secondary | ICD-10-CM | POA: Diagnosis not present

## 2023-11-08 DIAGNOSIS — I25119 Atherosclerotic heart disease of native coronary artery with unspecified angina pectoris: Secondary | ICD-10-CM | POA: Diagnosis not present

## 2023-11-08 DIAGNOSIS — I700 Atherosclerosis of aorta: Secondary | ICD-10-CM | POA: Diagnosis not present

## 2023-11-08 DIAGNOSIS — K219 Gastro-esophageal reflux disease without esophagitis: Secondary | ICD-10-CM | POA: Diagnosis not present

## 2023-11-08 DIAGNOSIS — E785 Hyperlipidemia, unspecified: Secondary | ICD-10-CM | POA: Diagnosis not present

## 2023-11-08 DIAGNOSIS — M1990 Unspecified osteoarthritis, unspecified site: Secondary | ICD-10-CM | POA: Diagnosis not present

## 2023-11-08 DIAGNOSIS — I429 Cardiomyopathy, unspecified: Secondary | ICD-10-CM | POA: Diagnosis not present

## 2023-11-08 DIAGNOSIS — Z833 Family history of diabetes mellitus: Secondary | ICD-10-CM | POA: Diagnosis not present

## 2023-11-08 DIAGNOSIS — I77819 Aortic ectasia, unspecified site: Secondary | ICD-10-CM | POA: Diagnosis not present

## 2023-11-08 DIAGNOSIS — Z8249 Family history of ischemic heart disease and other diseases of the circulatory system: Secondary | ICD-10-CM | POA: Diagnosis not present

## 2023-11-08 DIAGNOSIS — G620 Drug-induced polyneuropathy: Secondary | ICD-10-CM | POA: Diagnosis not present

## 2023-11-08 DIAGNOSIS — Z809 Family history of malignant neoplasm, unspecified: Secondary | ICD-10-CM | POA: Diagnosis not present

## 2023-11-08 DIAGNOSIS — E1122 Type 2 diabetes mellitus with diabetic chronic kidney disease: Secondary | ICD-10-CM | POA: Diagnosis not present

## 2023-11-09 DIAGNOSIS — Z9049 Acquired absence of other specified parts of digestive tract: Secondary | ICD-10-CM | POA: Diagnosis not present

## 2023-11-09 DIAGNOSIS — K219 Gastro-esophageal reflux disease without esophagitis: Secondary | ICD-10-CM | POA: Diagnosis not present

## 2023-11-09 DIAGNOSIS — Z85828 Personal history of other malignant neoplasm of skin: Secondary | ICD-10-CM | POA: Diagnosis not present

## 2023-11-09 DIAGNOSIS — N840 Polyp of corpus uteri: Secondary | ICD-10-CM | POA: Diagnosis not present

## 2023-11-09 DIAGNOSIS — Z882 Allergy status to sulfonamides status: Secondary | ICD-10-CM | POA: Diagnosis not present

## 2023-11-09 DIAGNOSIS — K769 Liver disease, unspecified: Secondary | ICD-10-CM | POA: Diagnosis not present

## 2023-11-09 DIAGNOSIS — Z79899 Other long term (current) drug therapy: Secondary | ICD-10-CM | POA: Diagnosis not present

## 2023-11-09 DIAGNOSIS — Z933 Colostomy status: Secondary | ICD-10-CM | POA: Diagnosis not present

## 2023-11-09 DIAGNOSIS — E039 Hypothyroidism, unspecified: Secondary | ICD-10-CM | POA: Diagnosis not present

## 2023-11-09 DIAGNOSIS — Z780 Asymptomatic menopausal state: Secondary | ICD-10-CM | POA: Diagnosis not present

## 2023-11-09 DIAGNOSIS — K449 Diaphragmatic hernia without obstruction or gangrene: Secondary | ICD-10-CM | POA: Diagnosis not present

## 2023-11-09 DIAGNOSIS — Z79890 Hormone replacement therapy (postmenopausal): Secondary | ICD-10-CM | POA: Diagnosis not present

## 2023-11-09 DIAGNOSIS — Z8616 Personal history of COVID-19: Secondary | ICD-10-CM | POA: Diagnosis not present

## 2023-11-09 DIAGNOSIS — Z808 Family history of malignant neoplasm of other organs or systems: Secondary | ICD-10-CM | POA: Diagnosis not present

## 2023-11-09 DIAGNOSIS — C20 Malignant neoplasm of rectum: Secondary | ICD-10-CM | POA: Diagnosis not present

## 2023-11-09 DIAGNOSIS — Z803 Family history of malignant neoplasm of breast: Secondary | ICD-10-CM | POA: Diagnosis not present

## 2023-11-09 DIAGNOSIS — J9811 Atelectasis: Secondary | ICD-10-CM | POA: Diagnosis not present

## 2023-11-09 DIAGNOSIS — I700 Atherosclerosis of aorta: Secondary | ICD-10-CM | POA: Diagnosis not present

## 2023-11-16 DIAGNOSIS — C187 Malignant neoplasm of sigmoid colon: Secondary | ICD-10-CM | POA: Diagnosis not present

## 2023-11-24 DIAGNOSIS — E039 Hypothyroidism, unspecified: Secondary | ICD-10-CM | POA: Diagnosis not present

## 2023-11-24 DIAGNOSIS — Z85038 Personal history of other malignant neoplasm of large intestine: Secondary | ICD-10-CM | POA: Diagnosis not present

## 2023-11-24 DIAGNOSIS — E785 Hyperlipidemia, unspecified: Secondary | ICD-10-CM | POA: Diagnosis not present

## 2023-11-24 DIAGNOSIS — Z6823 Body mass index (BMI) 23.0-23.9, adult: Secondary | ICD-10-CM | POA: Diagnosis not present

## 2023-12-20 DIAGNOSIS — Z08 Encounter for follow-up examination after completed treatment for malignant neoplasm: Secondary | ICD-10-CM | POA: Diagnosis not present

## 2023-12-20 DIAGNOSIS — Z85828 Personal history of other malignant neoplasm of skin: Secondary | ICD-10-CM | POA: Diagnosis not present

## 2023-12-20 DIAGNOSIS — L820 Inflamed seborrheic keratosis: Secondary | ICD-10-CM | POA: Diagnosis not present

## 2023-12-26 DIAGNOSIS — C187 Malignant neoplasm of sigmoid colon: Secondary | ICD-10-CM | POA: Diagnosis not present

## 2024-01-01 DIAGNOSIS — C187 Malignant neoplasm of sigmoid colon: Secondary | ICD-10-CM | POA: Diagnosis not present

## 2024-02-23 DIAGNOSIS — C187 Malignant neoplasm of sigmoid colon: Secondary | ICD-10-CM | POA: Diagnosis not present

## 2024-03-06 DIAGNOSIS — E039 Hypothyroidism, unspecified: Secondary | ICD-10-CM | POA: Diagnosis not present

## 2024-03-06 DIAGNOSIS — Z85038 Personal history of other malignant neoplasm of large intestine: Secondary | ICD-10-CM | POA: Diagnosis not present

## 2024-04-25 DIAGNOSIS — C187 Malignant neoplasm of sigmoid colon: Secondary | ICD-10-CM | POA: Diagnosis not present

## 2024-05-01 ENCOUNTER — Other Ambulatory Visit: Payer: Self-pay | Admitting: Medical Genetics

## 2024-05-02 DIAGNOSIS — C187 Malignant neoplasm of sigmoid colon: Secondary | ICD-10-CM | POA: Diagnosis not present

## 2024-05-06 ENCOUNTER — Other Ambulatory Visit (HOSPITAL_COMMUNITY)
Admission: RE | Admit: 2024-05-06 | Discharge: 2024-05-06 | Disposition: A | Discharge status: Home / Self Care | Payer: Self-pay | Source: Ambulatory Visit | Attending: Medical Genetics | Admitting: Medical Genetics

## 2024-05-13 LAB — GENECONNECT MOLECULAR SCREEN: Genetic Analysis Overall Interpretation: NEGATIVE

## 2024-06-19 DIAGNOSIS — Z1283 Encounter for screening for malignant neoplasm of skin: Secondary | ICD-10-CM | POA: Diagnosis not present

## 2024-06-19 DIAGNOSIS — E039 Hypothyroidism, unspecified: Secondary | ICD-10-CM | POA: Diagnosis not present

## 2024-06-19 DIAGNOSIS — Z6823 Body mass index (BMI) 23.0-23.9, adult: Secondary | ICD-10-CM | POA: Diagnosis not present

## 2024-06-19 DIAGNOSIS — Z08 Encounter for follow-up examination after completed treatment for malignant neoplasm: Secondary | ICD-10-CM | POA: Diagnosis not present

## 2024-06-19 DIAGNOSIS — E785 Hyperlipidemia, unspecified: Secondary | ICD-10-CM | POA: Diagnosis not present

## 2024-06-19 DIAGNOSIS — D225 Melanocytic nevi of trunk: Secondary | ICD-10-CM | POA: Diagnosis not present

## 2024-06-19 DIAGNOSIS — Z85828 Personal history of other malignant neoplasm of skin: Secondary | ICD-10-CM | POA: Diagnosis not present

## 2024-06-19 DIAGNOSIS — L2989 Other pruritus: Secondary | ICD-10-CM | POA: Diagnosis not present

## 2024-06-19 DIAGNOSIS — Z85038 Personal history of other malignant neoplasm of large intestine: Secondary | ICD-10-CM | POA: Diagnosis not present

## 2024-06-24 DIAGNOSIS — H43813 Vitreous degeneration, bilateral: Secondary | ICD-10-CM | POA: Diagnosis not present

## 2024-06-24 DIAGNOSIS — H25813 Combined forms of age-related cataract, bilateral: Secondary | ICD-10-CM | POA: Diagnosis not present

## 2024-06-24 DIAGNOSIS — H47091 Other disorders of optic nerve, not elsewhere classified, right eye: Secondary | ICD-10-CM | POA: Diagnosis not present

## 2024-06-24 DIAGNOSIS — H40023 Open angle with borderline findings, high risk, bilateral: Secondary | ICD-10-CM | POA: Diagnosis not present

## 2024-06-27 DIAGNOSIS — K439 Ventral hernia without obstruction or gangrene: Secondary | ICD-10-CM | POA: Diagnosis not present

## 2024-06-27 DIAGNOSIS — C187 Malignant neoplasm of sigmoid colon: Secondary | ICD-10-CM | POA: Diagnosis not present

## 2024-07-04 NOTE — Unmapped External Note (Signed)
 Patient chart reviewed by [facility] Pre-Procedure Services at [facility].  Per Adult Anesthesia Algorithm, patient meets criteria for No Visit Needed as evidenced by no significant medical history affecting anesthesia noted in Epic or Care Everywhere. No pre-op testing ordered at time of chart review.   Estimated body mass index is 23.63 kg/m as calculated from the following:   Height as of 06/27/24: 165.1 cm (5' 5).   Weight as of 06/27/24: 64.4 kg (142 lb).  Procedure date: 08/09/2024  Posted Procedure Location:  [facility]  Provider for procedure: Stitzenberg, Darice Hun, MD   Appointment information: appointment not needed

## 2024-07-30 DIAGNOSIS — H40023 Open angle with borderline findings, high risk, bilateral: Secondary | ICD-10-CM | POA: Diagnosis not present

## 2024-08-14 DIAGNOSIS — H40023 Open angle with borderline findings, high risk, bilateral: Secondary | ICD-10-CM | POA: Diagnosis not present

## 2024-08-28 DIAGNOSIS — C187 Malignant neoplasm of sigmoid colon: Secondary | ICD-10-CM | POA: Diagnosis not present

## 2024-08-28 NOTE — Progress Notes (Signed)
 "  [facility], [facility]  Hematology Oncology Consultation  Followup Visit Note  Patient Name: Vanessa Mcclure Patient Age: 68 y.o. Encounter Date: 09/02/2024  Referring Physician:  Letty Carder Bobette Raddle., MD [address] [facility] [place],  [place] [address]  Consulting Provider: Carder Bobette Letty Raddle., MD  Hematology/Oncology  Reason for Visit: Follow-up colon cancer.   Assessment/Plan: Ms. 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 She completed 5 months of infusional FOLFOX with Panitumumab  added during the time that it was considered she had stage IV disease based on a PET/CT. After therapy the PET/CT was reported as normal and the final conclusion was that the original abnormality on PET was in fact inflammatory, patient had Stage IIIB disease.    The standard adjuvant chemotherapy for this stage of colon cancer is 6 months of infusional 5-fluorouracil  based chemotherapy or capecitabine.  Both of these have the same potential cardiotoxicity. There is no adjuvant systemic chemotherapy that has been shown effective that does not include 5-FU.  Consequently, given the severity of the cardiotoxicity in this patient, the adjuvant chemotherapy was terminated in September 2023.     Cancer Staging <redacted file path>  Malignant neoplasm of sigmoid colon    (CMS-HCC) Staging form: Colon and Rectum, AJCC 8th Edition - Pathologic stage from 11/01/2021: Stage IIIB (pT3, pN1b, cM0) - Signed by Jackson Georgina Ip, MD on 05/17/2022 - Clinical stage from 05/17/2022: Stage IVC (cT3, cN1b, cM1c) - Signed by Jackson Georgina Ip, MD on 05/17/2022   I have reviewed the laboratory, pathology, and radiology reports in detail and discussed findings with the patient.  Interval History:   11/15/2022 - The patient was hospitalized in December 2023 with abdominal pain.  She underwent an exploratory laparotomy with  extensive lysis of adhesions, omentectomy and bilateral salpingo-oophorectomy.  The pathology report did not show any malignancy.  She has recovered well from her surgery.  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  She is on Neurontin.  She will stay under surveillance.  She will return for reevaluation in 4 months with lab work to be done a week prior to her visit.   01/01/2024 - The patient is clinically stable.  She still takes Neurontin for neuropathy.  Her CEA is normal.  She will remain under surveillance.  She will return for reevaluation in 4 months.    05/02/2024 - The patient's main problem continues to be neuropathy. She quit Neurontin because of the side-effects and that did not affect her neuropathy.  Her CEA continues to be normal.  She will remain under surveillance.  She will return for reevaluation in 4 months.   09/02/2024 - The patient is clinically stable. CEA is normal. She will return for reevaluation in 4 months.   Review of Systems  Constitutional: Negative.   HENT:  Negative.    Eyes: Negative.   Respiratory: Negative.    Cardiovascular: Negative.   Gastrointestinal: Negative.   Endocrine: Negative.   Genitourinary: Negative.    Musculoskeletal: Negative.   Skin: Negative.   Hematological: Negative.     Vital signs for this encounter: BSA: 1.73 meters squared BP 134/78   Pulse 74   Temp 37 C (98.6 F) (Temporal)   Resp 16   Wt 65.5 kg (144 lb 6.4 oz)   SpO2 99%   BMI 24.03 kg/m   Physical  Exam Constitutional:      Appearance: Normal appearance.  HENT:     Head: Normocephalic.     Mouth/Throat:     Mouth: Mucous membranes are moist.     Pharynx: Oropharynx is clear.  Eyes:     Conjunctiva/sclera: Conjunctivae normal.     Pupils: Pupils are equal, round, and reactive to light.  Pulmonary:     Breath sounds: Normal breath sounds.  Abdominal:     General: Abdomen is flat.     Palpations: Abdomen  is soft.  Musculoskeletal:        General: Normal range of motion.     Cervical back: Neck supple.  Skin:    General: Skin is warm.  Neurological:     Sensory: Sensory deficit present.     Karnofsky/Lansky Performance Status 70, Cares for self; unable to carry on normal activity or to do active work (ECOG equivalent 1)   Results:  WBC  Date Value Ref Range Status  08/28/2024 5.3 4.0 - 10.5 10*9/L Final   HGB  Date Value Ref Range Status  08/28/2024 13.5 11.5 - 15.0 g/dL Final   HCT  Date Value Ref Range Status  08/28/2024 42.9 34.0 - 44.0 % Final   Platelet  Date Value Ref Range Status  08/28/2024 277 140 - 415 10*9/L Final   Creatinine  Date Value Ref Range Status  12/26/2023 0.65 0.60 - 1.10 mg/dL Final   AST  Date Value Ref Range Status  08/28/2024 21 15 - 40 U/L Final   CEA  Date Value Ref Range Status  08/28/2024 <2.0 0.0 - 5.0 ng/mL Final  06/27/2024 <2.0 0.0 - 5.0 ng/mL Final  04/25/2024 <2.0 0.0 - 5.0 ng/mL Final  12/26/2023 <2.0 0.0 - 5.0 ng/mL Final  09/28/2023 <2.0 0.0 - 5.0 ng/mL Final  04/06/2023 <0.5 0.0 - 5.0 ng/mL Final  07/26/2022 0.6 0.0 - 5.0 ng/mL Final  06/28/2022 1.1 0.0 - 5.0 ng/mL Final  05/02/2022 1.3 0.0 - 5.0 ng/mL Final  04/11/2022 1.0 0.0 - 5.0 ng/mL Final  03/07/2022 0.7 0.0 - 5.0 ng/mL Final  02/08/2022 0.7 0.0 - 5.0 ng/mL Final  01/10/2022 1.3 0.0 - 5.0 ng/mL Final  12/10/2021 0.8 0.0 - 5.0 ng/mL Final  11/08/2021 0.5 0.0 - 5.0 ng/mL Final  11/01/2021 0.7 0.0 - 5.0 ng/mL Final  09/03/2021 1.3 0.0 - 5.0 ng/mL Final    "

## 2024-09-02 DIAGNOSIS — C187 Malignant neoplasm of sigmoid colon: Secondary | ICD-10-CM | POA: Diagnosis not present

## 2024-09-18 NOTE — Progress Notes (Signed)
Patient arrived to clinic ambulatory.  Vital signs obtained.  Patient in clinic for port flush only.  Port accessed per protocol with positive blood return.  Port flushed with saline and heparin per protocol and de-accessed.  Patient tolerated well without complaint.  Patient d/c to home and aware of next appointments.

## 2024-09-26 NOTE — Anesthesia Preprocedure Evaluation (Signed)
 Procedure Information:  Date/Time: 09/27/24 0730   Procedure: REPAIR OF ANTERIOR ABDOMINAL HERNIA(S) ANY APPROACH, INITIAL,  INCLUDING IMPLANTATION OF MESH OR OTHER PROSTHESIS WHEN PERFORMED, TOTAL  LENGTH OF DEFECT(S); LESS THAN 3 CM, REDUCIBLE   Anesthesia type: General   Pre-op diagnosis: Reducible ventral hernia   Location: [facility]RD [facility] OR 21 / OR [facility]   Surgeons: Stitzenberg, Darice Hun, MD     Anesthesia Evaluation     No history of anesthetic complications   Airway  Mallampati: II Jaw Protrusion: normal Neck ROM: full   Comment: ETT Placement Date: 09/16/22; Placement Time: 0922; Pre O2/Mask induction: Preoxygenated with 100% O2 by face mask; Mask ventilation: not attempted; Size: 7; Secured at: 21 cm; ETT Type: Oral; Cuffed: Cuffed; Insertion attempts: 1; View: IIa; Blade: MAC 3; Type of view: direct laryngoscopy; Airway Assistance: Anterior Laryngeal Pressure  Dental  normal dentition  no loose teeth     Pulmonary   (-) COPD, asthma, recent URI, no former tobacco use  Cardiovascular  (+) hyperlipidemia ,  (-) past MI, dysrhythmias, CHF  Rhythm: regular ROS comment: Cardiotoxicity secondary to 5-FU adjuvant chemotherapy  TTE 06/29/23: 1. Aorta size is normal (37-38 mm).   2. Left ventricular ejection fraction by 3D volume is 53 %. The left ventricle has low normal function. The left ventricle has no regional wall motion abnormalities. Left ventricular diastolic parameters are indeterminate. The average left ventricular  global longitudinal strain is -19.9 %. The global longitudinal strain is normal.   3. Right ventricular systolic function is normal. The right ventricular size is normal.   4. The mitral valve is myxomatous. Mild mitral valve regurgitation. No evidence of mitral stenosis. There is mild holosystolic prolapse of the middle scallop of the posterior leaflet of the mitral valve.   5. The aortic valve is normal in structure. Aortic valve regurgitation is  not visualized. No aortic stenosis is present.   6. The inferior vena cava is normal in size with greater than 50% respiratory variability, suggesting right atrial pressure of 3 mmHg.   Holter monitor 07/15/22: Predominant rhythm was NSR with first degree AVB. Average HR 70bpm. 4% PVC burden.   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 female with history of colorectal adenocarcinoma s/p colectomy and adjuvant chemotherapy, GERD with small hiatal hernia, and acquired hypothyroidism who presents for elective ventral hernia repair in the context of multiple abdominal surgeries.                 Anesthesia Plan  ASA 2     Anesthetic:  General  Standard lines and monitors.  Type of induction: IV Airway: endotracheal tube                                        Difficult Airway: Evaluate airway in OR   Post Procedure Pain Management:IV analgesics and oral pain medication.    Anesthesia plan and risk discussed with patient; informed consent obtained.  Use of blood products discussed with patient, whom consented to blood products.    Patient has not received blood  products within the last 30 days. Not Pregnant  Plan discussed with anesthesiologist and resident.    Relevant Problems  ANESTHESIA  (+) GERD (gastroesophageal reflux disease)

## 2024-09-27 NOTE — Interval H&P Note (Signed)
 DAY OF SURGERY UPDATE  H&P reviewed. The patient was examined and there are no changes to the H&P.  I reviewed the medical record. I saw and evaluated the patient before the procedure.  Site marking was not necessary.  Consent was checked. Proceed as planned.  Darice KATHEE Fairy, MD 7:24 AM

## 2024-09-27 NOTE — H&P (Signed)
 Brief Pre-operative History & Physical  Patient name: Vanessa Mcclure CSN: [id number] MRN: [id number] Admit Date: 09/27/2024 Date of Surgery: 09/27/2024 Performing Service: Surgical Oncology   Code Status: Full Code   Assessment/Plan:   Jahliyah is a 69 y.o. female with Reducible ventral hernia, who presents for: Procedures (LRB): REPAIR OF ANTERIOR ABDOMINAL HERNIA(S) ANY APPROACH, INITIAL, INCLUDING IMPLANTATION OF MESH OR OTHER PROSTHESIS WHEN PERFORMED, TOTAL LENGTH OF DEFECT(S); LESS THAN 3 CM, REDUCIBLE (N/A).   She presents for: Procedures (LRB): REPAIR OF ANTERIOR ABDOMINAL HERNIA(S) ANY APPROACH, INITIAL, INCLUDING IMPLANTATION OF MESH OR OTHER PROSTHESIS WHEN PERFORMED, TOTAL LENGTH OF DEFECT(S); LESS THAN 3 CM, REDUCIBLE (N/A).   Consent was obtained in the pre-op holding area. Risks, benefits, and alternatives to surgery were discussed, and all questions were answered.  Proceed to the OR as planned.     History of Present Illness:  Vanessa Mcclure is a 69 y.o. female with Reducible ventral hernia. She was recently seen in clinic, where a detailed HPI can be found. She was noted to benefit from: Procedures (LRB): REPAIR OF ANTERIOR ABDOMINAL HERNIA(S) ANY APPROACH, INITIAL, INCLUDING IMPLANTATION OF MESH OR OTHER PROSTHESIS WHEN PERFORMED, TOTAL LENGTH OF DEFECT(S); LESS THAN 3 CM, REDUCIBLE (N/A).    Allergies Piperacillin, Sulfa (sulfonamide antibiotics), and Zosyn [piperacillin-tazobactam]  Home Medications   Prior to Admission medications  Medication Sig Start Date End Date Taking? Authorizing Provider  esomeprazole (NEXIUM) 20 MG capsule Take 1 capsule (20 mg total) by mouth every morning.   Yes [provider]  KLOR-CON M20 20 mEq ER tablet Take 1 tablet by mouth once daily 07/05/23  Yes Vicci Lauraine Stabs, ANP  levothyroxine (SYNTHROID) 75 MCG tablet Take 1 tablet (75 mcg total) by mouth every other day. Alternating every other day  with Levothyroxine 50 mg 12/21/23  Yes [provider]  polyethylene glycol (GLYCOLAX) 17 gram/dose powder Take 17 g by mouth two (2) times a day.   Yes [provider]  levothyroxine (SYNTHROID) 50 MCG tablet Take 1 tablet (50 mcg total) by mouth every other day. Alternating every other day with levothyroxine 75 mg. 01/07/24   [provider]  rosuvastatin  (CRESTOR ) 20 MG tablet Take 1 tablet (20 mg total) by mouth daily. 06/22/22   [provider]    Vital Signs BP 148/77   Pulse 66   Temp 36.3 C (97.3 F)   Resp 18   Wt 66.8 kg (147 lb 4.3 oz)   SpO2 95%   BMI 24.51 kg/m  Facility age limit for growth %iles is 20 years. Facility age limit for growth %iles is 20 years..   Physical Exam General: Well developed, appears stated age, in no acute distress Mental status: Alert and oriented x3 Cardiovascular: Normal Pulmonary: Symmetric chest rise, unlabored breathing Relevant System for Surgery: Surgical site examined, reducible upper midline hernia with 1.5cm defect palpable.  Labs and Studies: Lab Results  Component Value Date   WBC 5.3 08/28/2024   HGB 13.5 08/28/2024   HCT 42.9 08/28/2024   PLT 277 08/28/2024    No results found for: PT, INR, APTT  Relevant Past Medical History: Past Medical History[1]  Relevant Past Surgical History: Past Surgical History[2]        [1] Past Medical History: Diagnosis Date   Angina pectoris    aching in middle of chest recent   Aortic atherosclerosis    Arthritis    thumbs   Cardiotoxicity (HHS-HCC) 05/24/2022   Cardiotoxicity secondary to infusional 5-FU  Colon cancer    (CMS-HCC)    diagnosed January 2023   Coronary artery disease 04-2021   COVID-19 01/2021   fever, coughing, fatigue   Difficult intravenous access    veins roll   Disease of thyroid  gland    Diverticulitis    GERD (gastroesophageal reflux disease)    Hiatal hernia    Hypothyroidism    Skin cancer     Basal cell   Snores    snoring loud enough to be heard outside of room and some apena  [2] Past Surgical History: Procedure Laterality Date   CHEMOTHERAPY     COLON SURGERY  09/03/2021   colon resection for perforated adenocarcinoma   COLONOSCOPY  08-2023   4 polyps removed   ECTOPIC PREGNANCY SURGERY  1979   HERNIA REPAIR  08-2022   When osteomy was reversed   PR CLOSE ENTEROSTOMY,RESEC+ANAST N/A 09/16/2022   Procedure: CLOSURE OF ENTEROSTOMY, LARGE OR SMALL INTESTINE; WITH RESECTION & ANASTOMOSIS, OTHER THAN COLORECTAL;  Surgeon: Ricka, Darice Hun, MD;  Location: MAIN OR [facility];  Service: Surgical Oncology   PR COLSC FLX W/RMVL OF TUMOR POLYP LESION SNARE TQ N/A 09/07/2022   Procedure: COLONOSCOPY FLEX; W/REMOV TUMOR/LES BY SNARE;  Surgeon: Conny Dorn Agent, MD;  [facility]: [facility];  Service: Gastroenterology   PR COLSC FLX W/RMVL OF TUMOR POLYP LESION SNARE TQ Left 09/07/2023   Procedure: COLONOSCOPY FLEX; W/REMOV TUMOR/LES BY SNARE;  Surgeon: Celestina Comer Norris, MD;  Location: [facility];  Service: Gastroenterology   PR EXPLORATORY OF ABDOMEN N/A 09/03/2021   Procedure: EXPLORATORY LAPAROTOMY, PERINEAL LAVAGE.POSS BOWEL RESECTION.END COLOSTOMY;  Surgeon: Ellaree Sergio Bolus, MD;  Location: OR [facility];  Service: General Surgery   PR HYSTEROSCOPY,W/ENDO BX N/A 11/09/2023   Procedure: HYSTEROSCOPY, SURGICAL; WITH SAMPLING (BIOPSY) OF ENDOMETRIUM &/OR POLYPECTOMY, W/WO D&C;  Surgeon: Eldonna Mays, MD;  Location: OR [facility];  Service: Gynecology Oncology   PR INCISION & DRAINAGE ABSCESS COMPLICATED/MULTIPLE N/A 01/21/2022   Procedure: INCISION & DRAINAGE OF ABDOMINAL WALL ABSCESS;AT COLOSTOMY SITE.;  Surgeon: Ellaree Sergio Bolus, MD;  Location: OR [facility];  Service: General Surgery   PR INSERT TUNNELED CV CATH WITH PORT Right 10/22/2021   Procedure: INSERTION OF TUNNELED CENTRALLY INSERTED CENTRAL VENOUS ACCESS  DEVICE WITH SUBCUTANEOUS PORT >= 5 YRS OLD;  Surgeon: Ellaree Sergio Bolus, MD;  Location: OR [facility];  Service: General Surgery

## 2024-09-30 ENCOUNTER — Telehealth: Payer: Self-pay

## 2024-09-30 NOTE — Transitions of Care (Post Inpatient/ED Visit) (Signed)
" ° °  09/30/2024  Name: Vanessa Mcclure MRN: [id number] DOB: 1956-02-04  Today's TOC FU Call Status: Today's TOC FU Call Status:: Unsuccessful Call (1st Attempt) Unsuccessful Call (1st Attempt) Date: 09/30/24  Attempted to reach the patient regarding the most recent Inpatient/ED visit.  TOC RN CM left confidential voice mail on designated cell phone number per DPR in EPIC, if any, and identified TOC RN CM as caller, and left a call back number, also noting that patient may leave a secure voice mail at this call back number.   No DPR left confidential voice message with call back number on listed contact number.      Follow Up Plan: Additional outreach attempts will be made to reach the patient to complete the Transitions of Care (Post Inpatient/ED visit) call.    Bing Edison MSN, RN RN [employer]  [facility] Office Hours M-F [phone] Direct Dial: [phone] Main Phone [phone]  Fax: [phone] [email or web address]    "

## 2024-10-02 ENCOUNTER — Telehealth: Payer: Self-pay

## 2024-10-02 NOTE — Transitions of Care (Post Inpatient/ED Visit) (Signed)
 Today's TOC FU Call Status: Today's TOC FU Call Status:: Successful TOC FU Call Completed TOC FU Call Complete Date: 10/02/24  Patient's Name and Date of Birth confirmed. Name, DOB  Transition Care Management Follow-up Telephone Call Date of Discharge: 09/27/24 Discharge Facility: Other Recruitment Consultant Facility) [facility] Type of Discharge: Inpatient Admission Primary Inpatient Discharge Diagnosis:: REPAIR OF ANTERIOR ABDOMINAL HERNIA(S) ANY APPROACH, INITIAL, INCLUDING IMPLANTATION OF MESH OR OTHER PROSTHESIS How have you been since you were released from the hospital?: Better Any questions or concerns?: No  Items Reviewed: Did you receive and understand the discharge instructions provided?: Yes Any new allergies since your discharge?: No Dietary orders reviewed?: No Do you have support at home?: Yes People in Home [RPT]: spouse Name of Support/Comfort Primary Source: Gainey,DONNIE R  Emergency Contact  [phone] (Home Phone)  Medications Reviewed Today:See below Medications Reviewed Today   Medications were not reviewed in this encounter    Discharge Medication per DC Summary, Patient stated she is taking all prescribed at discharge but declined review. Was able to pay for and obtain medications patient stated.  Medication Sig Dispense Quantity Refills Last Filled Start Date End Date  esomeprazole (NEXIUM) 20 MG capsule  Take 1 capsule (20 mg total) by mouth every morning.            KLOR-CON M20 20 mEq ER tablet  Take 1 tablet by mouth once daily 30 tablet      07/05/2023    levothyroxine (SYNTHROID) 50 MCG tablet  Take 1 tablet (50 mcg total) by mouth every other day. Alternating every other day with levothyroxine 75 mg.       01/07/2024    levothyroxine (SYNTHROID) 75 MCG tablet  Take 1 tablet (75 mcg total) by mouth every other day. Alternating every other day with Levothyroxine 50 mg       12/21/2023    oxyCODONE (ROXICODONE) 5 MG immediate release tablet  Take 1 tablet  (5 mg total) by mouth every four (4) hours as needed for pain for up to 5 days. 10 tablet      09/27/2024 10/02/2024  polyethylene glycol (GLYCOLAX) 17 gram/dose powder  Take 17 g by mouth two (2) times a day.            rosuvastatin  (CRESTOR ) 20 MG tablet  Take 1 tablet (20 mg total) by mouth daily.       06/22/2022      Home Care and Equipment/Supplies: Were Home Health Services Ordered?: No Any new equipment or medical supplies ordered?: No  Functional Questionnaire: Do you need assistance with bathing/showering or dressing?: No Do you need assistance with meal preparation?: No Do you need assistance with eating?: No Do you have difficulty maintaining continence: No Do you need assistance with getting out of bed/getting out of a chair/moving?: No Do you have difficulty managing or taking your medications?: No  Follow up appointments reviewed: PCP Follow-up appointment confirmed?: No (Rationale for PCP HFU explained to patient and pt. will call to set up appointment.) Specialist Hospital Follow-up appointment confirmed?: Yes Date of Specialist follow-up appointment?: 10/10/24 Follow-Up Specialty Provider:: [facility] Oncologist. Do you need transportation to your follow-up appointment?: No Do you understand care options if your condition(s) worsen?: Yes-patient verbalized understanding  SDOH Interventions Today    Flowsheet Row Most Recent Value  SDOH Interventions   Food Insecurity Interventions Intervention Not Indicated  Housing Interventions Intervention Not Indicated  Transportation Interventions Intervention Not Indicated, Patient Resources (Friends/Family)  Utilities Interventions  Intervention Not Indicated  Health Literacy Interventions Intervention Not Indicated   10/02/2024: Successful post discharge outreach with patient by telephone.  Patient stated she is doing pretty good, having some soreness from surgery, wearing an abdominal binder post ventral hernia repair.   Understands discharge instructions, what to call provider for, wound/incision care, no SDOH needs, no red flags on brief assessment permitted by patient over brief call.  Will make PCP HFU after rationale explained.  Patient verbalized understanding of how to contact benefit/payor for potential additional benefits.  Specialist follow up 1/22/12026.  Declined follow up call/30 Day program due to close following by [facility].   Bing Edison MSN, RN RN [employer]  [facility] Office Hours M-F [phone] Direct Dial: [phone] Main Phone [phone]  Fax: [phone] [email or web address]
